# Patient Record
Sex: Female | Born: 2000
Health system: Southern US, Community
[De-identification: ages and names within clinical notes are randomized; demographics above are authoritative.]

## PROBLEM LIST (undated history)

## (undated) DIAGNOSIS — E063 Autoimmune thyroiditis: Secondary | ICD-10-CM

## (undated) DIAGNOSIS — E039 Hypothyroidism, unspecified: Secondary | ICD-10-CM

## (undated) DIAGNOSIS — L0591 Pilonidal cyst without abscess: Secondary | ICD-10-CM

## (undated) DIAGNOSIS — E282 Polycystic ovarian syndrome: Secondary | ICD-10-CM

## (undated) HISTORY — DX: Polycystic ovarian syndrome: E28.2

## (undated) HISTORY — DX: Autoimmune thyroiditis: E06.3

## (undated) HISTORY — DX: Hypothyroidism, unspecified: E03.9

---
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
## 2019-01-07 MED FILL — LEVOTHYROXINE 75 MCG TABLET: 75 | 90 days supply | Qty: 90 | Fill #1

## 2019-01-08 ENCOUNTER — Other Ambulatory Visit: Payer: Self-pay

## 2019-01-08 ENCOUNTER — Ambulatory Visit (INDEPENDENT_AMBULATORY_CARE_PROVIDER_SITE_OTHER): Payer: 59

## 2019-01-08 DIAGNOSIS — Z23 Encounter for immunization: Secondary | ICD-10-CM | POA: Diagnosis not present

## 2019-02-21 MED FILL — JULEBER 0.15-30 MG-MCG TABS: 0.15-30 | 84 days supply | Qty: 84 | Fill #1

## 2019-04-27 NOTE — Progress Notes (Signed)
This visit occurred during the SARS-CoV-2 public health emergency.  Safety protocols were in place, including screening questions prior to the visit, additional usage of staff PPE, and extensive cleaning of exam room while observing appropriate contact time as indicated for disinfecting solutions.    Chief Complaint  Patient presents with  . Back Pain    upper back pain pt states has been going on for a week and hurts when she swallows food. Pt went to ED last night due to it hurting so bad and chest pain 2 days ago     HPI: Rachel Skinner 19 y.o. come in for  Fu  Ed for about 10 days of  Right periscapular back pain  intermittent flare worse with swallowing but no gerd or dysphagia . No cough sob injury  Became worse and right chest pain   At night and seen in ED   Had neg cv  eval c xray and neg d dimer   Felt to be  MS poss  muscle relaxant  May be helping  .  No cough fever gi changes   doesn't remember pill choking and no foot getting stuck     Asks about getting updated thyroid labs ROS: See pertinent positives and negatives per HPI.  Past Medical History:  Diagnosis Date  . Hashimoto's disease   . Pilonidal cyst 01/2013   is open and draining    Family History  Problem Relation Age of Onset  . Heart disease Maternal Grandfather        possible MI  . Thyroid disease Maternal Grandmother     Social History   Socioeconomic History  . Marital status: Single    Spouse name: Not on file  . Number of children: Not on file  . Years of education: Not on file  . Highest education level: Not on file  Occupational History  . Not on file  Tobacco Use  . Smoking status: Never Smoker  . Smokeless tobacco: Never Used  Substance and Sexual Activity  . Alcohol use: No  . Drug use: No  . Sexual activity: Never  Other Topics Concern  . Not on file  Social History Narrative                  Social Determinants of Health   Financial Resource Strain:   . Difficulty of  Paying Living Expenses: Not on file  Food Insecurity:   . Worried About Charity fundraiser in the Last Year: Not on file  . Ran Out of Food in the Last Year: Not on file  Transportation Needs:   . Lack of Transportation (Medical): Not on file  . Lack of Transportation (Non-Medical): Not on file  Physical Activity:   . Days of Exercise per Week: Not on file  . Minutes of Exercise per Session: Not on file  Stress:   . Feeling of Stress : Not on file  Social Connections:   . Frequency of Communication with Friends and Family: Not on file  . Frequency of Social Gatherings with Friends and Family: Not on file  . Attends Religious Services: Not on file  . Active Member of Clubs or Organizations: Not on file  . Attends Archivist Meetings: Not on file  . Marital Status: Not on file    Outpatient Medications Prior to Visit  Medication Sig Dispense Refill  . acetaminophen (TYLENOL) 500 MG tablet Take 1,000 mg by mouth every 6 (six) hours as needed for  mild pain.    . cyclobenzaprine (FLEXERIL) 10 MG tablet Take 1 tablet (10 mg total) by mouth 2 (two) times daily as needed for muscle spasms. 20 tablet 0  . desogestrel-ethinyl estradiol (ENSKYCE) 0.15-30 MG-MCG tablet Take 1 tablet by mouth daily. 84 tablet 3  . ibuprofen (ADVIL) 200 MG tablet Take 400 mg by mouth every 6 (six) hours as needed for moderate pain.    Marland Kitchen levothyroxine (SYNTHROID, LEVOTHROID) 75 MCG tablet Take 1 tablet (75 mcg total) by mouth daily. 90 tablet 3  . predniSONE (DELTASONE) 50 MG tablet Take 1 tablet (50 mg total) by mouth daily for 4 days. (Patient not taking: Reported on 04/29/2019) 4 tablet 0   No facility-administered medications prior to visit.     EXAM:  BP 118/62 (BP Location: Right Arm, Patient Position: Sitting, Cuff Size: Normal)   Pulse (!) 102   Temp 98.2 F (36.8 C) (Temporal)   Wt 170 lb 3.2 oz (77.2 kg)   SpO2 99%   BMI 31.13 kg/m   Body mass index is 31.13 kg/m.  GENERAL: vitals  reviewed and listed above, alert, oriented, appears well hydrated and in no acute distress HEENT: atraumatic, conjunctiva  clear, no obvious abnormalities on inspection of external nose and ears OP : masked   NECK: no obvious masses on inspection palpation  LUNGS: clear to auscultation bilaterally, no wheezes, rales or rhonchi, good air movement  Back no point tenderness CV: HRRR, no clubbing cyanosis or  peripheral edema nl cap refill  Abdomen:  Sof,t normal bowel sounds without hepatosplenomegaly, no guarding rebound or masses no CVA tenderness MS: moves all extremities without noticeable focal  abnormality PSYCH: pleasant and cooperative, no obvious depression or anxiety Lab Results  Component Value Date   WBC 6.5 04/28/2019   HGB 14.2 04/28/2019   HCT 45.7 04/28/2019   PLT 298 04/28/2019   GLUCOSE 94 04/28/2019   CHOL 189 11/27/2017   TRIG 139.0 11/27/2017   HDL 89.80 11/27/2017   LDLCALC 71 11/27/2017   ALT 24 04/28/2019   AST 25 04/28/2019   NA 139 04/28/2019   K 3.9 04/28/2019   CL 107 04/28/2019   CREATININE 0.74 04/28/2019   BUN 8 04/28/2019   CO2 18 (L) 04/28/2019   TSH 1.41 04/29/2019   BP Readings from Last 3 Encounters:  04/29/19 118/62  04/28/19 (!) 123/59  03/11/18 112/74   ED visit eval reviewed   ASSESSMENT AND PLAN:  Discussed the following assessment and plan:  Acute right-sided thoracic back pain  Atypical chest pain  Hashimoto's disease - Plan: TSH, T4, free Uncertain cause  Suspect  Ms cause but curious that  worse with swallowing without dysphagia   Follow   Time   Care with pill swallowing  If  Not a lot better in 2 weeks consider further eval  Such a esophagram  But no obstructive sx etc  .   -Patient advised to return or notify health care team  if  new concerns arise.  Patient Instructions  Take pills with plenty of water  To ensure passage through esophagus  Quickly.   Ok to take  Muscle relaxant  as needed   IF not getting better in 2  week reassess.   Skinner notify you  of labs when available.  Of thyroid .    Neta Mends. Liset Mcmonigle M.D.

## 2019-04-28 ENCOUNTER — Encounter (HOSPITAL_COMMUNITY): Payer: Self-pay | Admitting: Emergency Medicine

## 2019-04-28 ENCOUNTER — Emergency Department (HOSPITAL_COMMUNITY): Payer: 59

## 2019-04-28 ENCOUNTER — Emergency Department (HOSPITAL_COMMUNITY)
Admission: EM | Admit: 2019-04-28 | Discharge: 2019-04-28 | Disposition: A | Discharge status: Home / Self Care | Payer: 59 | Attending: Emergency Medicine | Admitting: Emergency Medicine

## 2019-04-28 ENCOUNTER — Other Ambulatory Visit: Payer: Self-pay

## 2019-04-28 DIAGNOSIS — Z79899 Other long term (current) drug therapy: Secondary | ICD-10-CM | POA: Insufficient documentation

## 2019-04-28 DIAGNOSIS — R0789 Other chest pain: Secondary | ICD-10-CM | POA: Diagnosis not present

## 2019-04-28 DIAGNOSIS — M549 Dorsalgia, unspecified: Secondary | ICD-10-CM | POA: Insufficient documentation

## 2019-04-28 DIAGNOSIS — T7840XA Allergy, unspecified, initial encounter: Secondary | ICD-10-CM

## 2019-04-28 DIAGNOSIS — R079 Chest pain, unspecified: Secondary | ICD-10-CM | POA: Diagnosis not present

## 2019-04-28 LAB — CBC WITH DIFFERENTIAL/PLATELET
Abs Immature Granulocytes: 0.01 10*3/uL (ref 0.00–0.07)
Basophils Absolute: 0 10*3/uL (ref 0.0–0.1)
Basophils Relative: 1 %
Eosinophils Absolute: 0 10*3/uL (ref 0.0–0.5)
Eosinophils Relative: 1 %
HCT: 45.7 % (ref 36.0–46.0)
Hemoglobin: 14.2 g/dL (ref 12.0–15.0)
Immature Granulocytes: 0 %
Lymphocytes Relative: 46 %
Lymphs Abs: 3 10*3/uL (ref 0.7–4.0)
MCH: 28.5 pg (ref 26.0–34.0)
MCHC: 31.1 g/dL (ref 30.0–36.0)
MCV: 91.6 fL (ref 80.0–100.0)
Monocytes Absolute: 0.4 10*3/uL (ref 0.1–1.0)
Monocytes Relative: 6 %
Neutro Abs: 3.1 10*3/uL (ref 1.7–7.7)
Neutrophils Relative %: 46 %
Platelets: 298 10*3/uL (ref 150–400)
RBC: 4.99 MIL/uL (ref 3.87–5.11)
RDW: 13.2 % (ref 11.5–15.5)
WBC: 6.5 10*3/uL (ref 4.0–10.5)
nRBC: 0 % (ref 0.0–0.2)

## 2019-04-28 LAB — COMPREHENSIVE METABOLIC PANEL
ALT: 24 U/L (ref 0–44)
AST: 25 U/L (ref 15–41)
Albumin: 3.8 g/dL (ref 3.5–5.0)
Alkaline Phosphatase: 40 U/L (ref 38–126)
Anion gap: 14 (ref 5–15)
BUN: 8 mg/dL (ref 6–20)
CO2: 18 mmol/L — ABNORMAL LOW (ref 22–32)
Calcium: 9.7 mg/dL (ref 8.9–10.3)
Chloride: 107 mmol/L (ref 98–111)
Creatinine, Ser: 0.74 mg/dL (ref 0.44–1.00)
GFR calc Af Amer: >60 mL/min (ref >60)
GFR calc non Af Amer: >60 mL/min (ref >60)
Glucose, Bld: 94 mg/dL (ref 70–99)
Potassium: 3.9 mmol/L (ref 3.5–5.1)
Sodium: 139 mmol/L (ref 135–145)
Total Bilirubin: 0.6 mg/dL (ref 0.3–1.2)
Total Protein: 7.3 g/dL (ref 6.5–8.1)

## 2019-04-28 LAB — LIPASE, BLOOD: Lipase: 28 U/L (ref 11–51)

## 2019-04-28 LAB — TROPONIN I (HIGH SENSITIVITY)
Troponin I (High Sensitivity): <2 ng/L (ref <18)
Troponin I (High Sensitivity): <2 ng/L (ref <18)

## 2019-04-28 LAB — URINALYSIS, ROUTINE W REFLEX MICROSCOPIC
Bilirubin Urine: NEGATIVE
Glucose, UA: NEGATIVE mg/dL
Hgb urine dipstick: NEGATIVE
Ketones, ur: NEGATIVE mg/dL
Leukocytes,Ua: NEGATIVE
Nitrite: NEGATIVE
Protein, ur: NEGATIVE mg/dL
Specific Gravity, Urine: 1.01 (ref 1.005–1.030)
pH: 6 (ref 5.0–8.0)

## 2019-04-28 LAB — LACTIC ACID, PLASMA
Lactic Acid, Venous: 1.8 mmol/L (ref 0.5–1.9)
Lactic Acid, Venous: 1.1 mmol/L (ref 0.5–1.9)

## 2019-04-28 LAB — D-DIMER, QUANTITATIVE: D-Dimer, Quant: <0.27 ug/mL-FEU (ref 0.00–0.50)

## 2019-04-28 LAB — PREGNANCY, URINE: Preg Test, Ur: NEGATIVE

## 2019-04-28 MED ORDER — FAMOTIDINE IN NACL 20-0.9 MG/50ML-% IV SOLN
20.0000 mg | Freq: Once | INTRAVENOUS | Status: AC
  Administered 2019-04-28: 20 mg via INTRAVENOUS
  Filled 2019-04-28: qty 50

## 2019-04-28 MED ORDER — PREDNISONE 50 MG PO TABS: 50.0000 mg | ORAL_TABLET | Freq: Every day | ORAL | 0 refills | Status: AC

## 2019-04-28 MED ORDER — SODIUM CHLORIDE 0.9 % IV BOLUS
500.0000 mL | Freq: Once | INTRAVENOUS | Status: AC
  Administered 2019-04-28: 500 mL via INTRAVENOUS

## 2019-04-28 MED ORDER — CYCLOBENZAPRINE HCL 10 MG PO TABS: 10.0000 mg | ORAL_TABLET | Freq: Two times a day (BID) | ORAL | 0 refills | Status: DC | PRN

## 2019-04-28 MED ORDER — DIPHENHYDRAMINE HCL 50 MG/ML IJ SOLN
25.0000 mg | Freq: Once | INTRAMUSCULAR | Status: AC
  Administered 2019-04-28: 25 mg via INTRAVENOUS
  Filled 2019-04-28: qty 1

## 2019-04-28 MED ORDER — METHYLPREDNISOLONE SODIUM SUCC 125 MG IJ SOLR
125.0000 mg | Freq: Once | INTRAMUSCULAR | Status: AC
  Administered 2019-04-28: 125 mg via INTRAVENOUS
  Filled 2019-04-28: qty 2

## 2019-04-28 MED ORDER — MORPHINE SULFATE (PF) 4 MG/ML IV SOLN
4.0000 mg | Freq: Once | INTRAVENOUS | Status: AC
  Administered 2019-04-28: 4 mg via INTRAVENOUS
  Filled 2019-04-28: qty 1

## 2019-04-28 MED FILL — predniSONE 50 MG TABS: 50 | 4 days supply | Qty: 4 | Fill #0

## 2019-04-28 MED FILL — CYCLOBENZAPRINE HCL 10 MG T: 10 | 10 days supply | Qty: 20 | Fill #0

## 2019-04-28 NOTE — ED Triage Notes (Signed)
Pt reports having back pain for past week. Has PCP appointment tomorrow but was woken up with intense CP at 2 AM that has not gone away. Denies h/a, n/v

## 2019-04-28 NOTE — Discharge Instructions (Signed)
Your work-up today was overall reassuring with no evidence of a cardiac or pulmonary cause of her symptoms.  The blood clot test and cardiac enzymes were negative.  Her chest x-ray was completely reassuring.  Your vital signs were also reassuring while you are being monitored.  Your EKG did not show evidence of heart injury or arrhythmias.  We suspected likely chest wall or musculoskeletal pain causing your discomfort as it was worsened with different positions and your other work-up is reassuring.  After he received the pain medication, you had an allergic reaction called anaphylaxis.  We treated it with the antihistamines and steroids which significant improved her symptoms.  After monitoring for several hours, we feel you are safe for discharge home.  We are giving a prescription for steroids for the next 4 days starting tomorrow as you received today's dose already.  Please also use the muscle relaxants to help with the musculoskeletal discomfort and possible muscle spasms in your back where I was examining you.  Please follow-up with your primary doctor and if any symptoms change or worsen, please return to the nearest emergency department.  Please rest and stay hydrated.

## 2019-04-28 NOTE — ED Provider Notes (Signed)
[facility] EMERGENCY DEPARTMENT Provider Note   CSN: [id number] Arrival date & time: 04/28/19  0109     History Chief Complaint  Patient presents with  . Chest Pain  . Back Pain    Rachel Skinner is a 19 y.o. female.  The history is provided by the patient and medical records. No language interpreter was used.  Chest Pain Pain location:  R chest and substernal area Pain quality: aching, pressure and sharp   Pain radiates to:  Upper back Pain severity:  Severe Onset quality:  Sudden Duration:  6 hours Timing:  Constant Progression:  Waxing and waning Chronicity:  New Relieved by:  Nothing Worsened by:  Certain positions Ineffective treatments:  None tried Associated symptoms: back pain   Associated symptoms: no abdominal pain, no claudication, no cough, no diaphoresis, no dysphagia, no fatigue, no fever, no headache, no lower extremity edema, no nausea, no palpitations, no shortness of breath, no vomiting and no weakness   Risk factors: birth control   Risk factors: no coronary artery disease, no diabetes mellitus, no hypertension, not female, not pregnant, no prior DVT/PE, no smoking and no surgery        Past Medical History:  Diagnosis Date  . Hashimoto's disease   . Pilonidal cyst 01/2013   is open and draining    Patient Active Problem List   Diagnosis Date Noted  . Hashimoto's disease 03/11/2018  . Left ear pain 03/30/2014  . Wears glasses 03/30/2014  . Infected pilonidal cyst 03/04/2013  . Pilonidal sinus with abscess 01/31/2013  . Hyperpigmentation of skin 09/23/2012    Past Surgical History:  Procedure Laterality Date  . PILONIDAL CYST EXCISION N/A 03/04/2013   Procedure: EXCISION PILONIDAL CYST PEDIATRIC;  Surgeon: Judie Petit. Leonia Corona, MD;  Location: [facility];  Service: Pediatrics;  Laterality: N/A;  pilonidal area     OB History   No obstetric history on file.     Family History  Problem Relation Age of  Onset  . Heart disease Maternal Grandfather        possible MI  . Thyroid disease Maternal Grandmother     Social History   Tobacco Use  . Smoking status: Never Smoker  . Smokeless tobacco: Never Used  Substance Use Topics  . Alcohol use: No  . Drug use: No    Home Medications Prior to Admission medications   Medication Sig Start Date End Date Taking? Authorizing Provider  desogestrel-ethinyl estradiol (ENSKYCE) 0.15-30 MG-MCG tablet Take 1 tablet by mouth daily. 11/26/18   Panosh, Neta Mends, MD  levothyroxine (SYNTHROID, LEVOTHROID) 75 MCG tablet Take 1 tablet (75 mcg total) by mouth daily. 03/11/18   Shamleffer, Konrad Dolores, MD  MULTIPLE VITAMIN PO Take by mouth.    [provider]    Allergies    Clindamycin/lincomycin  Review of Systems   Review of Systems  Constitutional: Negative for chills, diaphoresis, fatigue and fever.  HENT: Negative for congestion and trouble swallowing.   Respiratory: Negative for cough, chest tightness, shortness of breath, wheezing and stridor.   Cardiovascular: Positive for chest pain. Negative for palpitations, claudication and leg swelling.  Gastrointestinal: Negative for abdominal pain, constipation, diarrhea, nausea and vomiting.  Genitourinary: Negative for flank pain and frequency.  Musculoskeletal: Positive for back pain. Negative for neck pain and neck stiffness.  Skin: Negative for rash and wound.  Neurological: Negative for weakness, light-headedness and headaches.  Psychiatric/Behavioral: Negative for agitation.  All other systems reviewed and are  negative.   Physical Exam Updated Vital Signs BP (!) 147/91   Pulse 88   Temp 98.7 F (37.1 C) (Oral)   Resp 20   Ht 5\' 2"  (1.575 m)   Wt 77.1 kg   SpO2 100%   BMI 31.09 kg/m   Physical Exam Vitals and nursing note reviewed.  Constitutional:      General: She is not in acute distress.    Appearance: Normal appearance. She is well-developed. She is not  ill-appearing, toxic-appearing or diaphoretic.  HENT:     Head: Normocephalic and atraumatic.     Nose: No congestion.  Eyes:     Conjunctiva/sclera: Conjunctivae normal.  Cardiovascular:     Rate and Rhythm: Regular rhythm. Tachycardia present.     Heart sounds: No murmur.  Pulmonary:     Effort: Pulmonary effort is normal. No respiratory distress.     Breath sounds: Normal breath sounds. No decreased breath sounds, wheezing, rhonchi or rales.  Chest:     Chest wall: No tenderness.  Abdominal:     General: Abdomen is flat.     Palpations: Abdomen is soft.     Tenderness: There is no abdominal tenderness. There is right CVA tenderness. There is no left CVA tenderness, guarding or rebound.  Musculoskeletal:        General: Tenderness present.     Cervical back: Neck supple. Tenderness present.       Back:     Right lower leg: No tenderness. No edema.     Left lower leg: No tenderness. No edema.  Skin:    General: Skin is warm and dry.     Capillary Refill: Capillary refill takes less than 2 seconds.     Findings: No erythema.  Neurological:     General: No focal deficit present.     Mental Status: She is alert.  Psychiatric:        Mood and Affect: Mood normal.     ED Results / Procedures / Treatments   Labs (all labs ordered are listed, but only abnormal results are displayed) Labs Reviewed  COMPREHENSIVE METABOLIC PANEL - Abnormal; Notable for the following components:      Result Value   CO2 18 (*)    All other components within normal limits  URINALYSIS, ROUTINE W REFLEX MICROSCOPIC - Abnormal; Notable for the following components:   Color, Urine STRAW (*)    All other components within normal limits  URINE CULTURE  CBC WITH DIFFERENTIAL/PLATELET  LIPASE, BLOOD  D-DIMER, QUANTITATIVE (NOT AT [facility])  LACTIC ACID, PLASMA  LACTIC ACID, PLASMA  PREGNANCY, URINE  TROPONIN I (HIGH SENSITIVITY)  TROPONIN I (HIGH SENSITIVITY)    EKG EKG  Interpretation  Date/Time:  Thursday April 28 2019 08:26:44 EST Ventricular Rate:  86 PR Interval:    QRS Duration: 99 QT Interval:  343 QTC Calculation: 411 R Axis:   64 Text Interpretation: Sinus rhythm No prior ECG for comparison. No STEMI Confirmed by [birth year] (Theda Belfast) on 04/28/2019 8:28:24 AM Also confirmed by 04/30/2019 (Theda Belfast), editor [id number] [phone])  on 04/28/2019 9:24:51 AM   Radiology DG Chest 2 View  Result Date: 04/28/2019 CLINICAL DATA:  Right-sided upper back pain and chest pain. EXAM: CHEST - 2 VIEW COMPARISON:  None. FINDINGS: The heart size and mediastinal contours are within normal limits. Both lungs are clear. The visualized skeletal structures are unremarkable. IMPRESSION: Normal exam. Electronically Signed   By: 04/30/2019 M.D.   On: 04/28/2019 08:47  Procedures Procedures (including critical care time)  Medications Ordered in ED Medications  morphine 4 MG/ML injection 4 mg (4 mg Intravenous Given 04/28/19 1058)  sodium chloride 0.9 % bolus 500 mL (0 mLs Intravenous Stopped 04/28/19 1256)  diphenhydrAMINE (BENADRYL) injection 25 mg (25 mg Intravenous Given 04/28/19 1116)  methylPREDNISolone sodium succinate (SOLU-MEDROL) 125 mg/2 mL injection 125 mg (125 mg Intravenous Given 04/28/19 1116)  famotidine (PEPCID) IVPB 20 mg premix (0 mg Intravenous Stopped 04/28/19 1148)    ED Course  I have reviewed the triage vital signs and the nursing notes.  Pertinent labs & imaging results that were available during my care of the patient were reviewed by me and considered in my medical decision making (see chart for details).    MDM Rules/Calculators/A&P                      Rachel Skinner is a 19 y.o. female with a past medical history significant for Hashimoto's disease currently on Synthroid who presents with 1 week of right sided back pain followed by several hours of severe chest pain.  Patient reports that for the last week she has had  right-sided back pain that has been constant but waxing and waning in severity.  She reports that she scheduled an appointment with her PCP for tomorrow morning but this morning woke up at 2 AM with severe chest pain.  She reports it is initially on the right side of her chest but is now across her chest.  She reports it is a tightness and pressure and stabbing at times.  She reports that she does not think it is pleuritic or exertional but it does seem to change at times with positioning.  She denies any history of DVT or PE but she is on an estrogen-based birth control.  She also reports that her mother recently got diagnosed with a blood clot several weeks ago.  She reports no fevers or chills and denies any coughing whatsoever.  She denies significant shortness of breath.  She denies any trauma.  She denies any rashes over the area.  She denies any history of back injury or heavy lifting.  She denies any history of kidney stones including no recent hematuria dysuria or urinary symptoms.  She denies any emesis, constipation, or diarrhea.  She simply has the severe chest pain that is a 9 out of 10 in severity and severe back pain.  She does not smoke.  On exam, chest is nontender.  Right lower back is slightly tender to palpation somewhat near the CVA area.  Lungs are clear.  No murmur.  Patient is good pulses in all extremities.  Legs are nontender nonedematous.  Abdomen is nontender.  No CVA tenderness.  EKG shows no STEMI.  Patient's heart rate was tachycardic during my initial evaluation.  Given the patient's tachycardia during my exam, estrogen use, family history of thromboembolic disease, and the patient's severe back and chest discomfort, will get a D-dimer to help rule out PE as the cause.  Will get other labs and chest x-ray as well.  Lower suspicion for kidney stone as etiology of discomfort but we will check a urinalysis.  She did not want pain medicine during initial work-up.  Anticipate  reassessment after work-up.  11:12 AM The patient's work-up returned overall reassuring.  Troponin negative x2.  D-dimer is undetectable.  Urinalysis and other labs reassuring.  Now we suspect is more likely musculoskeletal pain.  Due to the  continued discomfort, we agreed to give the patient a dose of IV pain medicine to try and help her symptoms before we felt she would be safe for going home.  After receiving the morphine, patient broke out with an anaphylactic reaction with urticaria diffusely, erythema, and jitteriness.  She did not have throat swelling or wheezing, nausea, or vomiting at this time.  Patient was given IV medications including steroids and antihistamines to help with her reaction and be monitored.  On reassessment, patient's reaction was drastically improving.  Rash on the chest and torso has nearly resolved.  Itching is better.  Patient is feeling better.  Will allow her to rest and monitor for recurrent reaction however anticipate she will be stable for discharge home with likely musculoskeletal chest pain to follow-up with PCP and be with family.  I spoke on the patient's phone via FaceTime with the patient's father who is a physician at this facility and we discussed the work-up and management.  He agrees with discharge after she is feeling better.  2:33 PM Patient's allergic reaction has completely resolved after steroids and antihistamines.  She is feeling much better.  Her chest pain is also resolved after the pain medicine.  Suspect muscular discomfort.  Had a shared decision made conversation with patient and we agreed that she was safe for discharge home.  She did want to transiently different than just the anti-inflammatory medications like Motrin Tylenol that she is already attempted to take and would be interested in a muscle relaxant.  We will give her prescription for Flexeril as well as steroids for the allergic reaction.  She is encouraged to take Benadryl if  needed for itching or rash recurrence.  Patient will follow up with her PCP and understands return cautions.  Patient discharged in good condition with resolution of symptoms.  Final Clinical Impression(s) / ED Diagnoses Final diagnoses:  Atypical chest pain  Upper back pain  Allergic reaction to drug, initial encounter    Rx / DC Orders ED Discharge Orders         Ordered    predniSONE (DELTASONE) 50 MG tablet  Daily     04/28/19 1436    cyclobenzaprine (FLEXERIL) 10 MG tablet  2 times daily PRN     04/28/19 1436         Clinical Impression: 1. Atypical chest pain   2. Upper back pain   3. Allergic reaction to drug, initial encounter     Disposition: Discharge  Condition: Good  I have discussed the results, Dx and Tx plan with the pt(& family if present). He/she/they expressed understanding and agree(s) with the plan. Discharge instructions discussed at great length. Strict return precautions discussed and pt &/or family have verbalized understanding of the instructions. No further questions at time of discharge.    New Prescriptions   CYCLOBENZAPRINE (FLEXERIL) 10 MG TABLET    Take 1 tablet (10 mg total) by mouth 2 (two) times daily as needed for muscle spasms.   PREDNISONE (DELTASONE) 50 MG TABLET    Take 1 tablet (50 mg total) by mouth daily for 4 days.    Follow Up: Madelin Headings, MD [address] [place] [address] [phone]     [facility] EMERGENCY DEPARTMENT [address] [id number] mc [place] [address] [phone]       Robbi Scurlock, Canary Brim, MD 04/28/19 [phone]

## 2019-04-28 NOTE — ED Notes (Signed)
Patient transported to XR. 

## 2019-04-29 ENCOUNTER — Encounter: Payer: Self-pay | Admitting: Internal Medicine

## 2019-04-29 ENCOUNTER — Ambulatory Visit (INDEPENDENT_AMBULATORY_CARE_PROVIDER_SITE_OTHER): Payer: 59 | Admitting: Internal Medicine

## 2019-04-29 VITALS — BP 118/62 | HR 102 | Temp 98.2°F | Wt 170.2 lb

## 2019-04-29 DIAGNOSIS — R0789 Other chest pain: Secondary | ICD-10-CM | POA: Diagnosis not present

## 2019-04-29 DIAGNOSIS — E063 Autoimmune thyroiditis: Secondary | ICD-10-CM

## 2019-04-29 DIAGNOSIS — M546 Pain in thoracic spine: Secondary | ICD-10-CM | POA: Diagnosis not present

## 2019-04-29 LAB — TSH: TSH: 1.41 u[IU]/mL (ref 0.40–5.00)

## 2019-04-29 LAB — URINE CULTURE

## 2019-04-29 LAB — T4, FREE: Free T4: 0.88 ng/dL (ref 0.60–1.60)

## 2019-04-29 NOTE — Patient Instructions (Signed)
Take pills with plenty of water  To ensure passage through esophagus  Quickly.   Ok to take  Muscle relaxant  as needed   IF not getting better in 2 week reassess.   Will notify you  of labs when available.  Of thyroid .

## 2019-05-13 MED FILL — JULEBER 0.15-30 MG-MCG TABS: 0.15-30 | 84 days supply | Qty: 84 | Fill #2

## 2019-05-17 ENCOUNTER — Encounter: Payer: 59 | Admitting: Internal Medicine

## 2019-06-06 ENCOUNTER — Other Ambulatory Visit: Payer: Self-pay | Admitting: Internal Medicine

## 2019-06-07 ENCOUNTER — Other Ambulatory Visit: Payer: Self-pay | Admitting: Internal Medicine

## 2019-06-07 NOTE — Telephone Encounter (Signed)
I looks like this medication was filled for a year last year from Dr. Lonzo Cloud at [facility], 03/11/2018,  # 90 with 3 refills.  I refused yesterday and it is coming back again. Are you filling this now or do you know if Dr. Lonzo Cloud is still seeing patient?

## 2019-06-07 NOTE — Telephone Encounter (Signed)
[facility] was last  prescriber  dr Lonzo Cloud should prescribe   Who saw her last in June 2020   However if she wants [place] to prescribe  And follow we can fill this  For 6 mos

## 2019-06-08 NOTE — Telephone Encounter (Signed)
Called patient and left a detailed voice message to see if she is still seeing Dr. Lonzo Cloud or if something has changed as to why we keep getting this request.

## 2019-06-10 ENCOUNTER — Other Ambulatory Visit: Payer: Self-pay | Admitting: Internal Medicine

## 2019-06-13 NOTE — Telephone Encounter (Signed)
Called and left a detailed voice message to ask patient if she is still seeing Dr. Lonzo Cloud for this prescription or if she is wanting Dr. Fabian Sharp to prescribe.

## 2019-06-16 ENCOUNTER — Other Ambulatory Visit: Payer: Self-pay

## 2019-06-16 ENCOUNTER — Encounter: Payer: Self-pay | Admitting: Internal Medicine

## 2019-06-16 NOTE — Progress Notes (Signed)
This visit occurred during the SARS-CoV-2 public health emergency.  Safety protocols were in place, including screening questions prior to the visit, additional usage of staff PPE, and extensive cleaning of exam room while observing appropriate contact time as indicated for disinfecting solutions.    Chief Complaint  Patient presents with  . Annual Exam    Doing okay    HPI: Patient  Rachel Skinner  19 y.o. comes in today for [place] visit  Thyroid   Asks [place] to take over the medication taking daily has had ts done in Feb and in range  Needs reill OCP  Still acceptable  1 p no sti sx  See last screen   Health Maintenance  Topic Date Due  . INFLUENZA VACCINE  10/02/2019  . TETANUS/TDAP  04/24/2021  . HIV Screening  Completed   Health Maintenance Review LIFESTYLE:  Exercise:    Gym   4 x per week.  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     ROS:  GEN/ HEENT: No fever, significant weight changes sweats headaches vision problems hearing changes, CV/ PULM; No chest pain shortness of breath cough, syncope,edema  change in exercise tolerance. GI /GU: No adominal pain, vomiting, change in bowel habits. No blood in the stool. No significant GU symptoms. SKIN/HEME: ,no acute skin rashes suspicious lesions or bleeding. No lymphadenopathy, nodules, masses.  NEURO/ PSYCH:  No neurologic signs such as weakness numbness. No depression anxiety. IMM/ Allergy: No unusual infections.  Allergy .   REST of 12 system review negative except as per HPI   Past Medical History:  Diagnosis Date  . Hashimoto's disease   . Infected pilonidal cyst 03/04/2013  . Pilonidal cyst 01/2013   is open and draining  . Pilonidal sinus with abscess 01/31/2013   draining   no mass  effect  empriric antibiotic cause of sx persistent and tender  fu in 2-3 weeks       Past Surgical History:  Procedure Laterality Date  . PILONIDAL CYST EXCISION N/A 03/04/2013   Procedure: EXCISION PILONIDAL CYST PEDIATRIC;  Surgeon: Jerilynn Mages. Gerald Stabs, MD;  Location: [facility];  Service: Pediatrics;  Laterality: N/A;  pilonidal area    Family History  Problem Relation Age of Onset  . Heart disease Maternal Grandfather        possible MI  . Thyroid disease Maternal Grandmother     Social History   Socioeconomic History  . Marital status: Single    Spouse name: Not on file  . Number of children: Not on file  . Years of education: Not on file  . Highest education level: Not on file  Occupational History  . Not on file  Tobacco Use  . Smoking status: Never Smoker  . Smokeless tobacco: Never Used  Substance and Sexual Activity  . Alcohol use: No  . Drug use: No  . Sexual activity: Never  Other Topics Concern  . Not on file  Social History Narrative                  Social Determinants of Health   Financial Resource Strain:   . Difficulty of Paying Living Expenses:   Food Insecurity:   . Worried About  Running Out of Food in the Last Year:   . [place] in the Last Year:   Transportation Needs:   . Lack of Transportation (Medical):   Marland Kitchen Lack of Transportation (Non-Medical):   Physical Activity:   . Days of Exercise per Week:   . Minutes of Exercise per Session:   Stress:   . Feeling of Stress :   Social Connections:   . Frequency of Communication with Friends and Family:   . Frequency of Social Gatherings with Friends and Family:   . Attends Religious Services:   . Active Member of Clubs or Organizations:   . Attends Archivist Meetings:   Marland Kitchen Marital Status:     Outpatient Medications Prior to Visit  Medication Sig Dispense Refill  . acetaminophen (TYLENOL) 500 MG tablet Take 1,000 mg by mouth every 6 (six) hours as needed for  mild pain.    Marland Kitchen desogestrel-ethinyl estradiol (ENSKYCE) 0.15-30 MG-MCG tablet Take 1 tablet by mouth daily. 84 tablet 3  . ibuprofen (ADVIL) 200 MG tablet Take 400 mg by mouth every 6 (six) hours as needed for moderate pain.    Marland Kitchen levothyroxine (SYNTHROID, LEVOTHROID) 75 MCG tablet Take 1 tablet (75 mcg total) by mouth daily. 90 tablet 3  . cyclobenzaprine (FLEXERIL) 10 MG tablet Take 1 tablet (10 mg total) by mouth 2 (two) times daily as needed for muscle spasms. (Patient not taking: Reported on 06/17/2019) 20 tablet 0   No facility-administered medications prior to visit.     EXAM:  BP 122/78   Pulse 82   Temp 98 F (36.7 C) (Temporal)   Ht 5' 2.25" (1.581 m)   Wt 164 lb 6.4 oz (74.6 kg)   SpO2 98%   BMI 29.83 kg/m   Body mass index is 29.83 kg/m. Wt Readings from Last 3 Encounters:  06/17/19 164 lb 6.4 oz (74.6 kg) (89 %, Z= 1.24)*  04/29/19 170 lb 3.2 oz (77.2 kg) (92 %, Z= 1.39)*  04/28/19 170 lb (77.1 kg) (92 %, Z= 1.38)*   * Growth percentiles are based on CDC (Girls, 2-20 Years) data.    Physical Exam: Vital signs reviewed EPP:IRJJ is a well-developed well-nourished alert cooperative    who appearsr stated age in no acute distress.  HEENT: normocephalic atraumatic , Eyes: PERRL EOM's full, conjunctiva clear,., Ears: no deformity EAC's soft wax  clear TMs with normal landmarks.partly viewed  Mouth: clear OP, masked NECK: supple without masses, thyromegaly or bruits. CHEST/PULM:  Clear to auscultation and percussion breath sounds equal no wheeze , rales or rhonchi. No chest wall deformities or tenderness. Breast: normal by inspection . No dimpling, discharge, masses, tenderness or discharge . CV: PMI is nondisplaced, S1 S2 no gallops, murmurs, rubs. Peripheral pulses are full without delay.No JVD .  ABDOMEN: Bowel sounds normal nontender  No guard or rebound, no hepato splenomegal no CVA tenderness.  No hernia. Extremtities:  No clubbing cyanosis or edema, no acute joint  swelling or redness no focal atrophy NEURO:  Oriented x3, cranial nerves 3-12 appear to be intact, no obvious focal weakness,gait within normal limits no abnormal reflexes or asymmetrical SKIN: No acute rashes normal turgor, color, no bruising or petechiae. PSYCH: Oriented, good eye contact, no obvious depression anxiety, cognition and judgment appear normal. LN: no cervical axillary inguinal adenopathy  Lab Results  Component Value Date   WBC 6.5 04/28/2019   HGB 14.2 04/28/2019   HCT 45.7 04/28/2019   PLT 298 04/28/2019  GLUCOSE 94 04/28/2019   CHOL 189 11/27/2017   TRIG 139.0 11/27/2017   HDL 89.80 11/27/2017   LDLCALC 71 11/27/2017   ALT 24 04/28/2019   AST 25 04/28/2019   NA 139 04/28/2019   K 3.9 04/28/2019   CL 107 04/28/2019   CREATININE 0.74 04/28/2019   BUN 8 04/28/2019   CO2 18 (L) 04/28/2019   TSH 1.41 04/29/2019    BP Readings from Last 3 Encounters:  06/17/19 122/78  04/29/19 118/62  04/28/19 (!) 123/59    Lab plan  reviewed with patient   ASSESSMENT AND PLAN:  Discussed the following assessment and plan:    ICD-10-CM   1. Visit for preventive health examination  Z00.00 Lipid panel  2. Medication management  Z79.899 Lipid panel  3. Oral contraceptive use  Z30.41   4. Hashimoto's disease  E06.3 Lipid panel  5. Screening, lipid  Z13.220 Lipid panel   fasting today   attend to BMI lsi continue  Refill meds as indecated for a  Year .  Or as needed   Return in about 1 year (around 06/16/2020) for preventive /cpx and medications.  Patient Care Team: Imri Lor, Standley Brooking, MD as PCP - General (Internal Medicine) Patient Instructions  Continue attention to lifestyle intervention healthy eating and exercise .  Will refill  Take over thyroid medication  To be seen yearly cpx   Td booster due in 2023     Preventive Care 54-103 Years Old, Female Preventive care refers to lifestyle choices and visits with your health care provider that can promote health and  wellness. At this stage in your life, you may start seeing a primary care physician instead of a pediatrician. Your health care is now your responsibility. Preventive care for young adults includes:  A yearly physical exam. This is also called an annual wellness visit.  Regular dental and eye exams.  Immunizations.  Screening for certain conditions.  Healthy lifestyle choices, such as diet and exercise. What can I expect for my preventive care visit? Physical exam Your health care provider may check:  Height and weight. These may be used to calculate body mass index (BMI), which is a measurement that tells if you are at a healthy weight.  Heart rate and blood pressure.  Body temperature. Counseling Your health care provider may ask you questions about:  Past medical problems and family medical history.  Alcohol, tobacco, and drug use.  Home and relationship well-being.  Access to firearms.  Emotional well-being.  Diet, exercise, and sleep habits.  Sexual activity and sexual health.  Method of birth control.  Menstrual cycle.  Pregnancy history. What immunizations do I need?  Influenza (flu) vaccine  This is recommended every year. Tetanus, diphtheria, and pertussis (Tdap) vaccine  You may need a Td booster every 10 years. Varicella (chickenpox) vaccine  You may need this vaccine if you have not already been vaccinated. Human papillomavirus (HPV) vaccine  If recommended by your health care provider, you may need three doses over 6 months. Measles, mumps, and rubella (MMR) vaccine  You may need at least one dose of MMR. You may also need a second dose. Meningococcal conjugate (MenACWY) vaccine  One dose is recommended if you are 34-73 years old and a Market researcher living in a residence hall, or if you have one of several medical conditions. You may also need additional booster doses. Pneumococcal conjugate (PCV13) vaccine  You may need this  if you have certain conditions and were  not previously vaccinated. Pneumococcal polysaccharide (PPSV23) vaccine  You may need one or two doses if you smoke cigarettes or if you have certain conditions. Hepatitis A vaccine  You may need this if you have certain conditions or if you travel or work in places where you may be exposed to hepatitis A. Hepatitis B vaccine  You may need this if you have certain conditions or if you travel or work in places where you may be exposed to hepatitis B. Haemophilus influenzae type b (Hib) vaccine  You may need this if you have certain risk factors. You may receive vaccines as individual doses or as more than one vaccine together in one shot (combination vaccines). Talk with your health care provider about the risks and benefits of combination vaccines. What tests do I need? Blood tests  Lipid and cholesterol levels. These may be checked every 5 years starting at age 34.  Hepatitis C test.  Hepatitis B test. Screening  Pelvic exam and Pap test. This may be done every 3 years starting at age 41.  Sexually transmitted disease (STD) testing, if you are at risk.  BRCA-related cancer screening. This may be done if you have a family history of breast, ovarian, tubal, or peritoneal cancers. Other tests  Tuberculosis skin test.  Vision and hearing tests.  Skin exam.  Breast exam. Follow these instructions at home: Eating and drinking   Eat a diet that includes fresh fruits and vegetables, whole grains, lean protein, and low-fat dairy products.  Drink enough fluid to keep your urine pale yellow.  Do not drink alcohol if: ? Your health care provider tells you not to drink. ? You are pregnant, may be pregnant, or are planning to become pregnant. ? You are under the legal drinking age. In the [place] the legal drinking age is 60.  If you drink alcohol: ? Limit how much you have to 0-1 drink a day. ? Be aware of how much alcohol is in your  drink. In the [place] one drink equals one 12 oz bottle of beer (355 mL), one 5 oz glass of wine (148 mL), or one 1 oz glass of hard liquor (44 mL). Lifestyle  Take daily care of your teeth and gums.  Stay active. Exercise at least 30 minutes 5 or more days of the week.  Do not use any products that contain nicotine or tobacco, such as cigarettes, e-cigarettes, and chewing tobacco. If you need help quitting, ask your health care provider.  Do not use drugs.  If you are sexually active, practice safe sex. Use a condom or other form of birth control (contraception) in order to prevent pregnancy and STIs (sexually transmitted infections). If you plan to become pregnant, see your health care provider for a pre-conception visit.  Find healthy ways to cope with stress, such as: ? Meditation, yoga, or listening to music. ? Journaling. ? Talking to a trusted person. ? Spending time with friends and family. Safety  Always wear your seat belt while driving or riding in a vehicle.  Do not drive if you have been drinking alcohol. Do not ride with someone who has been drinking.  Do not drive when you are tired or distracted. Do not text while driving.  Wear a helmet and other protective equipment during sports activities.  If you have firearms in your house, make sure you follow all gun safety procedures.  Seek help if you have been bullied, physically abused, or sexually abused.  Use the Internet  responsibly to avoid dangers such as online bullying and online sex predators. What's next?  Go to your health care provider once a year for a well check visit.  Ask your health care provider how often you should have your eyes and teeth checked.  Stay up to date on all vaccines. This information is not intended to replace advice given to you by your health care provider. Make sure you discuss any questions you have with your health care provider. Document Revised: 02/11/2018 Document Reviewed:  02/11/2018 Elsevier Patient Education  2020 [employer] Jazleen Robeck M.D.

## 2019-06-17 ENCOUNTER — Encounter: Payer: Self-pay | Admitting: Internal Medicine

## 2019-06-17 ENCOUNTER — Ambulatory Visit (INDEPENDENT_AMBULATORY_CARE_PROVIDER_SITE_OTHER): Payer: 59 | Admitting: Internal Medicine

## 2019-06-17 ENCOUNTER — Other Ambulatory Visit: Payer: Self-pay | Admitting: Internal Medicine

## 2019-06-17 ENCOUNTER — Other Ambulatory Visit: Payer: Self-pay

## 2019-06-17 VITALS — BP 122/78 | HR 82 | Temp 98.0°F | Ht 62.25 in | Wt 164.4 lb

## 2019-06-17 DIAGNOSIS — Z79899 Other long term (current) drug therapy: Secondary | ICD-10-CM | POA: Diagnosis not present

## 2019-06-17 DIAGNOSIS — E063 Autoimmune thyroiditis: Secondary | ICD-10-CM | POA: Diagnosis not present

## 2019-06-17 DIAGNOSIS — Z13220 Encounter for screening for lipoid disorders: Secondary | ICD-10-CM | POA: Diagnosis not present

## 2019-06-17 DIAGNOSIS — Z3041 Encounter for surveillance of contraceptive pills: Secondary | ICD-10-CM | POA: Diagnosis not present

## 2019-06-17 DIAGNOSIS — Z0000 Encounter for general adult medical examination without abnormal findings: Secondary | ICD-10-CM

## 2019-06-17 LAB — LIPID PANEL
Cholesterol: 201 mg/dL — ABNORMAL HIGH (ref 0–200)
HDL: 82.6 mg/dL (ref >39.00)
LDL Cholesterol: 99 mg/dL (ref 0–99)
NonHDL: 118.17
Total CHOL/HDL Ratio: 2
Triglycerides: 96 mg/dL (ref 0.0–149.0)
VLDL: 19.2 mg/dL (ref 0.0–40.0)

## 2019-06-17 MED ORDER — LEVOTHYROXINE SODIUM 75 MCG PO TABS: 75.0000 ug | ORAL_TABLET | Freq: Every day | ORAL | 3 refills | Status: DC

## 2019-06-17 MED FILL — LEVOTHYROXINE SODIUM 75 MCG: 75 | 90 days supply | Qty: 90 | Fill #0

## 2019-06-17 NOTE — Progress Notes (Signed)
Lipid panel still favorable

## 2019-06-17 NOTE — Patient Instructions (Addendum)
Continue attention to lifestyle intervention healthy eating and exercise .  Will refill  Take over thyroid medication  To be seen yearly cpx   Td booster due in 2023     Preventive Care 16-20 Years Old, Female Preventive care refers to lifestyle choices and visits with your health care provider that can promote health and wellness. At this stage in your life, you may start seeing a primary care physician instead of a pediatrician. Your health care is now your responsibility. Preventive care for young adults includes:  A yearly physical exam. This is also called an annual wellness visit.  Regular dental and eye exams.  Immunizations.  Screening for certain conditions.  Healthy lifestyle choices, such as diet and exercise. What can I expect for my preventive care visit? Physical exam Your health care provider may check:  Height and weight. These may be used to calculate body mass index (BMI), which is a measurement that tells if you are at a healthy weight.  Heart rate and blood pressure.  Body temperature. Counseling Your health care provider may ask you questions about:  Past medical problems and family medical history.  Alcohol, tobacco, and drug use.  Home and relationship well-being.  Access to firearms.  Emotional well-being.  Diet, exercise, and sleep habits.  Sexual activity and sexual health.  Method of birth control.  Menstrual cycle.  Pregnancy history. What immunizations do I need?  Influenza (flu) vaccine  This is recommended every year. Tetanus, diphtheria, and pertussis (Tdap) vaccine  You may need a Td booster every 10 years. Varicella (chickenpox) vaccine  You may need this vaccine if you have not already been vaccinated. Human papillomavirus (HPV) vaccine  If recommended by your health care provider, you may need three doses over 6 months. Measles, mumps, and rubella (MMR) vaccine  You may need at least one dose of MMR. You may also  need a second dose. Meningococcal conjugate (MenACWY) vaccine  One dose is recommended if you are 20-20 years old and a Market researcher living in a residence hall, or if you have one of several medical conditions. You may also need additional booster doses. Pneumococcal conjugate (PCV13) vaccine  You may need this if you have certain conditions and were not previously vaccinated. Pneumococcal polysaccharide (PPSV23) vaccine  You may need one or two doses if you smoke cigarettes or if you have certain conditions. Hepatitis A vaccine  You may need this if you have certain conditions or if you travel or work in places where you may be exposed to hepatitis A. Hepatitis B vaccine  You may need this if you have certain conditions or if you travel or work in places where you may be exposed to hepatitis B. Haemophilus influenzae type b (Hib) vaccine  You may need this if you have certain risk factors. You may receive vaccines as individual doses or as more than one vaccine together in one shot (combination vaccines). Talk with your health care provider about the risks and benefits of combination vaccines. What tests do I need? Blood tests  Lipid and cholesterol levels. These may be checked every 5 years starting at age 20.  Hepatitis C test.  Hepatitis B test. Screening  Pelvic exam and Pap test. This may be done every 3 years starting at age 20.  Sexually transmitted disease (STD) testing, if you are at risk.  BRCA-related cancer screening. This may be done if you have a family history of breast, ovarian, tubal, or peritoneal cancers.  Other tests  Tuberculosis skin test.  Vision and hearing tests.  Skin exam.  Breast exam. Follow these instructions at home: Eating and drinking   Eat a diet that includes fresh fruits and vegetables, whole grains, lean protein, and low-fat dairy products.  Drink enough fluid to keep your urine pale yellow.  Do not drink alcohol  if: ? Your health care provider tells you not to drink. ? You are pregnant, may be pregnant, or are planning to become pregnant. ? You are under the legal drinking age. In the [place] the legal drinking age is 45.  If you drink alcohol: ? Limit how much you have to 0-1 drink a day. ? Be aware of how much alcohol is in your drink. In the [place] one drink equals one 12 oz bottle of beer (355 mL), one 5 oz glass of wine (148 mL), or one 1 oz glass of hard liquor (44 mL). Lifestyle  Take daily care of your teeth and gums.  Stay active. Exercise at least 30 minutes 5 or more days of the week.  Do not use any products that contain nicotine or tobacco, such as cigarettes, e-cigarettes, and chewing tobacco. If you need help quitting, ask your health care provider.  Do not use drugs.  If you are sexually active, practice safe sex. Use a condom or other form of birth control (contraception) in order to prevent pregnancy and STIs (sexually transmitted infections). If you plan to become pregnant, see your health care provider for a pre-conception visit.  Find healthy ways to cope with stress, such as: ? Meditation, yoga, or listening to music. ? Journaling. ? Talking to a trusted person. ? Spending time with friends and family. Safety  Always wear your seat belt while driving or riding in a vehicle.  Do not drive if you have been drinking alcohol. Do not ride with someone who has been drinking.  Do not drive when you are tired or distracted. Do not text while driving.  Wear a helmet and other protective equipment during sports activities.  If you have firearms in your house, make sure you follow all gun safety procedures.  Seek help if you have been bullied, physically abused, or sexually abused.  Use the Internet responsibly to avoid dangers such as online bullying and online sex predators. What's next?  Go to your health care provider once a year for a well check visit.  Ask your  health care provider how often you should have your eyes and teeth checked.  Stay up to date on all vaccines. This information is not intended to replace advice given to you by your health care provider. Make sure you discuss any questions you have with your health care provider. Document Revised: 02/11/2018 Document Reviewed: 02/11/2018 Elsevier Patient Education  2020 [employer].

## 2019-08-05 MED FILL — JULEBER 0.15-30 MG-MCG TABS: 0.15-30 | 84 days supply | Qty: 84 | Fill #3

## 2019-10-11 MED FILL — LEVOTHYROXINE SODIUM 75 MCG: 75 | 90 days supply | Qty: 90 | Fill #1

## 2019-10-25 ENCOUNTER — Other Ambulatory Visit: Payer: Self-pay

## 2019-10-25 MED ORDER — DESOGESTREL-ETHINYL ESTRADIOL 0.15-30 MG-MCG PO TABS: 1.0000 | ORAL_TABLET | Freq: Every day | ORAL | 0 refills | Status: DC

## 2019-12-23 ENCOUNTER — Other Ambulatory Visit: Payer: Self-pay | Admitting: Internal Medicine

## 2020-01-03 DIAGNOSIS — Z20828 Contact with and (suspected) exposure to other viral communicable diseases: Secondary | ICD-10-CM | POA: Diagnosis not present

## 2020-01-18 MED ORDER — DESOGESTREL-ETHINYL ESTRADIOL 0.15-30 MG-MCG PO TABS: 1.0000 | ORAL_TABLET | Freq: Every day | ORAL | 1 refills | Status: DC

## 2020-02-27 MED FILL — LEVOTHYROXINE 75 MCG TABLET: 75 | 90 days supply | Qty: 90 | Fill #2

## 2020-03-12 ENCOUNTER — Other Ambulatory Visit: Payer: Self-pay | Admitting: Internal Medicine

## 2020-03-12 MED ORDER — DESOGESTREL-ETHINYL ESTRADIOL 0.15-30 MG-MCG PO TABS: 1.0000 | ORAL_TABLET | Freq: Every day | ORAL | 1 refills | Status: DC

## 2020-03-12 NOTE — Telephone Encounter (Signed)
Sent in electronically .  Arrange  Yearly visit  Before runs out of medication( last one was in April 2021)

## 2020-03-14 MED FILL — JULEBER 0.15-30 MG-MCG TABS: 0.15-30 | 84 days supply | Qty: 84 | Fill #0

## 2020-03-30 DIAGNOSIS — Z03818 Encounter for observation for suspected exposure to other biological agents ruled out: Secondary | ICD-10-CM | POA: Diagnosis not present

## 2020-06-04 ENCOUNTER — Other Ambulatory Visit (HOSPITAL_COMMUNITY): Payer: Self-pay

## 2020-06-04 ENCOUNTER — Other Ambulatory Visit: Payer: Self-pay

## 2020-06-04 MED ORDER — DESOGESTREL-ETHINYL ESTRADIOL 0.15-30 MG-MCG PO TABS
1.0000 | ORAL_TABLET | Freq: Every day | ORAL | 0 refills | Status: DC
  Filled 2020-06-04: qty 84, 84d supply, fill #0

## 2020-06-06 ENCOUNTER — Other Ambulatory Visit (HOSPITAL_COMMUNITY): Payer: Self-pay

## 2020-07-13 ENCOUNTER — Other Ambulatory Visit: Payer: Self-pay

## 2020-07-13 MED ORDER — LEVOTHYROXINE SODIUM 75 MCG PO TABS
ORAL_TABLET | Freq: Every day | ORAL | 0 refills | Status: DC
Start: 2020-07-13 — End: 2020-08-17

## 2020-07-13 NOTE — Telephone Encounter (Signed)
Pt is calling to check the status of the medication and would like to see if it can be called in today so that she can pick it up due to her going out of town today.  She is aware that the msg is on her providers desktop for her to answer.

## 2020-07-13 NOTE — Telephone Encounter (Signed)
Rx sent to pt [facility] pharmacy per Dr Clent Ridges

## 2020-07-13 NOTE — Telephone Encounter (Signed)
I see she is scheduled to see Dr. Fabian Sharp on June 13. Please call in a 30 day refill of this med to her [facility]

## 2020-08-12 NOTE — Progress Notes (Signed)
Chief Complaint  Patient presents with   Annual Exam     HPI: Patient  Rachel Skinner  20 y.o. comes in today for Preventive Health Care visit and medicine check. She is come down from [place] after graduating and to return this summer working at [employer].  Will be going into new apartment  No major health changes   right pain i Dull discomfort at times does not usually use Q-tips Concerned about weight gain ever since she has been on thyroid medicine is due for thyroid testing would like [place] to initiate a referral to Dr. Dr Sharl Ma endocrinology as possible  Has weight gain with help with nutrition dietitian dietician.   Sleep: 6- 8   hours Actiivity  not as much  .  No sig sweet beverages . Ocass etoh neg td .  Tries to eat  self. Not too unhealthy.  Feels from thyroid . Takes correctly .  1 partner for years no STI noted risk Periods fairly regular on OCPs no concerns Has not yet had Pap smear pelvic would like to delay today.   Health Maintenance  Topic Date Due   Pneumococcal Vaccine 56-71 Years old (1 - PCV) Never done   Hepatitis C Screening  Never done   Zoster Vaccines- Shingrix (1 of 2) Never done   COVID-19 Vaccine (4 - Booster for Moderna series) 05/15/2020   PAP-Cervical Cytology Screening  07/20/2020   PAP SMEAR-Modifier  07/20/2020   INFLUENZA VACCINE  10/01/2020   TETANUS/TDAP  04/24/2021   HPV VACCINES  Completed   HIV Screening  Completed   Health Maintenance Review LIFESTYLE:  Exercise:   Tobacco/ETS: Alcohol:  Sugar beverages: Sleep: Drug use: no HH of  Work: Periods  3 days    ROS:  GEN/ HEENT: No fever, significant weight changes sweats headaches vision problems hearing changes, CV/ PULM; No chest pain shortness of breath cough, syncope,edema  change in exercise tolerance. GI /GU: No adominal pain, vomiting, change in bowel habits. No blood in the stool. No significant GU symptoms. SKIN/HEME: ,no acute skin rashes  suspicious lesions or bleeding. No lymphadenopathy, nodules, masses.  NEURO/ PSYCH:  No neurologic signs such as weakness numbness. No depression anxiety. IMM/ Allergy: No unusual infections.  Allergy .   REST of 12 system review negative except as per HPI   Past Medical History:  Diagnosis Date   Hashimoto's disease    Infected pilonidal cyst 03/04/2013   Pilonidal cyst 01/2013   is open and draining   Pilonidal sinus with abscess 01/31/2013   draining   no mass effect  empriric antibiotic cause of sx persistent and tender  fu in 2-3 weeks       Past Surgical History:  Procedure Laterality Date   PILONIDAL CYST EXCISION N/A 03/04/2013   Procedure: EXCISION PILONIDAL CYST PEDIATRIC;  Surgeon: Judie Petit. Leonia Corona, MD;  Location: [facility];  Service: Pediatrics;  Laterality: N/A;  pilonidal area    Family History  Problem Relation Age of Onset   Heart disease Maternal Grandfather        possible MI   Thyroid disease Maternal Grandmother     Social History   Socioeconomic History   Marital status: Single    Spouse name: Not on file   Number of children: Not on file   Years of education: Not on file   Highest education level: Not on file  Occupational History   Not on file  Tobacco  Use   Smoking status: Never   Smokeless tobacco: Never  Vaping Use   Vaping Use: Never used  Substance and Sexual Activity   Alcohol use: No   Drug use: No   Sexual activity: Never  Other Topics Concern   Not on file  Social History Narrative                  Social Determinants of Health   Financial Resource Strain: Not on file  Food Insecurity: Not on file  Transportation Needs: Not on file  Physical Activity: Not on file  Stress: Not on file  Social Connections: Not on file    Outpatient Medications Prior to Visit  Medication Sig Dispense Refill   acetaminophen (TYLENOL) 500 MG tablet Take 1,000 mg by mouth every 6 (six) hours as needed for mild pain.      cyclobenzaprine (FLEXERIL) 10 MG tablet Take 1 tablet (10 mg total) by mouth 2 (two) times daily as needed for muscle spasms. 20 tablet 0   desogestrel-ethinyl estradiol (APRI) 0.15-30 MG-MCG tablet Take 1 tablet by mouth daily. 84 tablet 0   ibuprofen (ADVIL) 200 MG tablet Take 400 mg by mouth every 6 (six) hours as needed for moderate pain.     levothyroxine (SYNTHROID) 75 MCG tablet TAKE 1 TABLET (75 MCG TOTAL) BY MOUTH DAILY. 30 tablet 0   No facility-administered medications prior to visit.     EXAM:  BP 130/88 (BP Location: Left Arm, Patient Position: Sitting, Cuff Size: Normal)   Pulse 91   Temp 98.3 F (36.8 C) (Oral)   Ht  (1.575 m)   Wt 182 lb 3.2 oz (82.6 kg)   LMP 07/30/2020 (Approximate)   SpO2 95%   BMI 33.32 kg/m   Body mass index is 33.32 kg/m. Wt Readings from Last 3 Encounters:  08/13/20 182 lb 3.2 oz (82.6 kg)  06/17/19 164 lb 6.4 oz (74.6 kg) (89 %, Z= 1.24)*  04/29/19 170 lb 3.2 oz (77.2 kg) (92 %, Z= 1.39)*   * Growth percentiles are based on CDC (Girls, 2-20 Years) data.    Physical Exam: Vital signs reviewed [id number] is a well-developed well-nourished alert cooperative    who appearsr stated age in no acute distress.  HEENT: normocephalic atraumatic , Eyes: PERRL EOM's full, conjunctiva clear, Nares: paten,t no deformity discharge or tenderness., Ears: no deformity EAC's soft wax right more than left  brown  Mouth: masked  NECK: supple without masses, thyroid palpable no nodules felt  CHEST/PULM:  Clear to auscultation and percussion breath sounds equal no wheeze , rales or rhonchi. No chest wall deformities or tenderness. Breast: normal by inspection . No dimpling, discharge, masses, tenderness or discharge . CV: PMI is nondisplaced, S1 S2 no gallops, murmurs, rubs. Peripheral pulses are full without delay.No JVD .  ABDOMEN: Bowel sounds normal nontender  No guard or rebound, no hepato splenomegal no CVA tenderness.  Extremtities:  No clubbing  cyanosis or edema, no acute joint swelling or redness no focal atrophy NEURO:  Oriented x3, cranial nerves 3-12 appear to be intact, no obvious focal weakness,gait within normal limits no abnormal reflexes or asymmetrical SKIN: No acute rashes normal turgor, color, no bruising or petechiae. PSYCH: Oriented, good eye contact, no obvious depression anxiety, cognition and judgment appear normal. LN: no cervical axillary inguinal adenopathy  Lab Results  Component Value Date   WBC 6.5 04/28/2019   HGB 14.2 04/28/2019   HCT 45.7 04/28/2019   PLT 298 04/28/2019  GLUCOSE 94 04/28/2019   CHOL 201 (H) 06/17/2019   TRIG 96.0 06/17/2019   HDL 82.60 06/17/2019   LDLCALC 99 06/17/2019   ALT 24 04/28/2019   AST 25 04/28/2019   NA 139 04/28/2019   K 3.9 04/28/2019   CL 107 04/28/2019   CREATININE 0.74 04/28/2019   BUN 8 04/28/2019   CO2 18 (L) 04/28/2019   TSH 1.41 04/29/2019    BP Readings from Last 3 Encounters:  08/13/20 130/88  06/17/19 122/78  04/29/19 118/62    Lab plan reviewed with patient  fasting today   ASSESSMENT AND PLAN:  Discussed the following assessment and plan:    ICD-10-CM   1. Visit for preventive health examination  Z00.00 Basic metabolic panel    CBC with Differential/Platelet    Hepatic function panel    Lipid panel    TSH    T4, free    Hemoglobin A1c    Hemoglobin A1c    T4, free    TSH    Lipid panel    Hepatic function panel    CBC with Differential/Platelet    Basic metabolic panel    2. Oral contraceptive use  Z30.41     3. Hashimoto's disease  E06.3 Basic metabolic panel    CBC with Differential/Platelet    Hepatic function panel    Lipid panel    TSH    T4, free    Hemoglobin A1c    Amb ref to Medical Nutrition Therapy-MNT    Ambulatory referral to Endocrinology    Hemoglobin A1c    T4, free    TSH    Lipid panel    Hepatic function panel    CBC with Differential/Platelet    Basic metabolic panel    4. Medication management   Z79.899 Basic metabolic panel    CBC with Differential/Platelet    Hepatic function panel    Lipid panel    TSH    T4, free    Hemoglobin A1c    Ambulatory referral to Endocrinology    Hemoglobin A1c    T4, free    TSH    Lipid panel    Hepatic function panel    CBC with Differential/Platelet    Basic metabolic panel    5. Weight gain  R63.5 Basic metabolic panel    CBC with Differential/Platelet    Hepatic function panel    Lipid panel    TSH    T4, free    Hemoglobin A1c    Amb ref to Medical Nutrition Therapy-MNT    Ambulatory referral to Endocrinology    Hemoglobin A1c    T4, free    TSH    Lipid panel    Hepatic function panel    CBC with Differential/Platelet    Basic metabolic panel    6. Need for hepatitis C screening test  Z11.59 Hepatitis C antibody    Hepatitis C antibody    7. Wax in ear  H61.20     Is very adherent to her thyroid medication due for monitoring Weight gain has been a problem could be lifestyle plus minus thyroid. Will refer to nutrition dietitian as requested and reviewed some basics. Tracking weight watchres type  sleep etc. Will place referral to Dr. Sharl Ma as requested. She will be due for her first Pap smear smear this year as for delay can be done here or elsewhere or GYN during the coming years ,low risk. See instructions. Will send in 90  days refills as appropriate  depending on tsh  levels  Return for depending on results and as indicated  at least yearly. .  Patient Care Team: Chianna Spirito, Neta Mends, MD as PCP - General (Internal Medicine) Patient Instructions  Exam is fine but have wax in ear . Can use softening drops and then otc flushing  if not can come back for  removal.  Due for PAP and routine  GYNE exam this year . Can be done here or  elsewhere Will notify you  of labs when available.  Will do a nutritional referral . And to Dr. Sharl Ma as requested.   Avoid   processed carbohydrates and added sugars to foods.  Avoid portion  distortion. Get your sleep  Track intake and activity sleep level to help make changes.  Health Maintenance, Female Adopting a healthy lifestyle and getting preventive care are important in promoting health and wellness. Ask your health care provider about: The right schedule for you to have regular tests and exams. Things you can do on your own to prevent diseases and keep yourself healthy. What should I know about diet, weight, and exercise? Eat a healthy diet  Eat a diet that includes plenty of vegetables, fruits, low-fat dairy products, and lean protein. Do not eat a lot of foods that are high in solid fats, added sugars, or sodium.  Maintain a healthy weight Body mass index (BMI) is used to identify weight problems. It estimates body fat based on height and weight. Your health care provider can help determineyour BMI and help you achieve or maintain a healthy weight. Get regular exercise Get regular exercise. This is one of the most important things you can do for your health. Most adults should: Exercise for at least 150 minutes each week. The exercise should increase your heart rate and make you sweat (moderate-intensity exercise). Do strengthening exercises at least twice a week. This is in addition to the moderate-intensity exercise. Spend less time sitting. Even light physical activity can be beneficial. Watch cholesterol and blood lipids Have your blood tested for lipids and cholesterol at 21 years of age, then havethis test every 5 years. Have your cholesterol levels checked more often if: Your lipid or cholesterol levels are high. You are older than 21 years of age. You are at high risk for heart disease. What should I know about cancer screening? Depending on your health history and family history, you may need to have cancer screening at various ages. This may include screening for: Breast cancer. Cervical cancer. Colorectal cancer. Skin cancer. Lung cancer. What  should I know about heart disease, diabetes, and high blood pressure? Blood pressure and heart disease High blood pressure causes heart disease and increases the risk of stroke. This is more likely to develop in people who have high blood pressure readings, are of African descent, or are overweight. Have your blood pressure checked: Every 3-5 years if you are 23-108 years of age. Every year if you are 72 years old or older. Diabetes Have regular diabetes screenings. This checks your fasting blood sugar level. Have the screening done: Once every three years after age 22 if you are at a normal weight and have a low risk for diabetes. More often and at a younger age if you are overweight or have a high risk for diabetes. What should I know about preventing infection? Hepatitis B If you have a higher risk for hepatitis B, you should be screened for this virus. Talk with your  health care provider to find out if you are at risk forhepatitis B infection. Hepatitis C Testing is recommended for: Everyone born from [birth year] through 1965. Anyone with known risk factors for hepatitis C. Sexually transmitted infections (STIs) Get screened for STIs, including gonorrhea and chlamydia, if: You are sexually active and are younger than 21 years of age. You are older than 21 years of age and your health care provider tells you that you are at risk for this type of infection. Your sexual activity has changed since you were last screened, and you are at increased risk for chlamydia or gonorrhea. Ask your health care provider if you are at risk. Ask your health care provider about whether you are at high risk for HIV. Your health care provider may recommend a prescription medicine to help prevent HIV infection. If you choose to take medicine to prevent HIV, you should first get tested for HIV. You should then be tested every 3 months for as long as you are taking the medicine. Pregnancy If you are about to stop having  your period (premenopausal) and you may become pregnant, seek counseling before you get pregnant. Take 400 to 800 micrograms (mcg) of folic acid every day if you become pregnant. Ask for birth control (contraception) if you want to prevent pregnancy. Osteoporosis and menopause Osteoporosis is a disease in which the bones lose minerals and strength with aging. This can result in bone fractures. If you are 73103 years old or older, or if you are at risk for osteoporosis and fractures, ask your health care provider if you should: Be screened for bone loss. Take a calcium or vitamin D supplement to lower your risk of fractures. Be given hormone replacement therapy (HRT) to treat symptoms of menopause. Follow these instructions at home: Lifestyle Do not use any products that contain nicotine or tobacco, such as cigarettes, e-cigarettes, and chewing tobacco. If you need help quitting, ask your health care provider. Do not use street drugs. Do not share needles. Ask your health care provider for help if you need support or information about quitting drugs. Alcohol use Do not drink alcohol if: Your health care provider tells you not to drink. You are pregnant, may be pregnant, or are planning to become pregnant. If you drink alcohol: Limit how much you use to 0-1 drink a day. Limit intake if you are breastfeeding. Be aware of how much alcohol is in your drink. In the [place] one drink equals one 12 oz bottle of beer (355 mL), one 5 oz glass of wine (148 mL), or one 1 oz glass of hard liquor (44 mL). General instructions Schedule regular health, dental, and eye exams. Stay current with your vaccines. Tell your health care provider if: You often feel depressed. You have ever been abused or do not feel safe at home. Summary Adopting a healthy lifestyle and getting preventive care are important in promoting health and wellness. Follow your health care provider's instructions about healthy diet,  exercising, and getting tested or screened for diseases. Follow your health care provider's instructions on monitoring your cholesterol and blood pressure. This information is not intended to replace advice given to you by your health care provider. Make sure you discuss any questions you have with your healthcare provider. Document Revised: 02/10/2018 Document Reviewed: 02/10/2018 Elsevier Patient Education  2022 [employer]     TancredWanda K. Tarren Sabree M.D.

## 2020-08-13 ENCOUNTER — Other Ambulatory Visit: Payer: Self-pay

## 2020-08-13 ENCOUNTER — Encounter: Payer: Self-pay | Admitting: Internal Medicine

## 2020-08-13 ENCOUNTER — Ambulatory Visit (INDEPENDENT_AMBULATORY_CARE_PROVIDER_SITE_OTHER): Payer: 59 | Admitting: Internal Medicine

## 2020-08-13 VITALS — BP 130/88 | HR 91 | Temp 98.3°F | Ht 62.0 in | Wt 182.2 lb

## 2020-08-13 DIAGNOSIS — Z1159 Encounter for screening for other viral diseases: Secondary | ICD-10-CM | POA: Diagnosis not present

## 2020-08-13 DIAGNOSIS — Z0000 Encounter for general adult medical examination without abnormal findings: Secondary | ICD-10-CM | POA: Diagnosis not present

## 2020-08-13 DIAGNOSIS — E063 Autoimmune thyroiditis: Secondary | ICD-10-CM

## 2020-08-13 DIAGNOSIS — Z79899 Other long term (current) drug therapy: Secondary | ICD-10-CM | POA: Diagnosis not present

## 2020-08-13 DIAGNOSIS — H6120 Impacted cerumen, unspecified ear: Secondary | ICD-10-CM | POA: Diagnosis not present

## 2020-08-13 DIAGNOSIS — R635 Abnormal weight gain: Secondary | ICD-10-CM | POA: Diagnosis not present

## 2020-08-13 DIAGNOSIS — Z3041 Encounter for surveillance of contraceptive pills: Secondary | ICD-10-CM

## 2020-08-13 LAB — CBC WITH DIFFERENTIAL/PLATELET
Basophils Absolute: 0 10*3/uL (ref 0.0–0.1)
Basophils Relative: 0.4 % (ref 0.0–3.0)
Eosinophils Absolute: 0 10*3/uL (ref 0.0–0.7)
Eosinophils Relative: 0.7 % (ref 0.0–5.0)
HCT: 41.1 % (ref 36.0–46.0)
Hemoglobin: 13.7 g/dL (ref 12.0–15.0)
Lymphocytes Relative: 34.3 % (ref 12.0–46.0)
Lymphs Abs: 1.7 10*3/uL (ref 0.7–4.0)
MCHC: 33.3 g/dL (ref 30.0–36.0)
MCV: 89.6 fl (ref 78.0–100.0)
Monocytes Absolute: 0.3 10*3/uL (ref 0.1–1.0)
Monocytes Relative: 5.9 % (ref 3.0–12.0)
Neutro Abs: 2.8 10*3/uL (ref 1.4–7.7)
Neutrophils Relative %: 58.7 % (ref 43.0–77.0)
Platelets: 291 10*3/uL (ref 150.0–400.0)
RBC: 4.59 Mil/uL (ref 3.87–5.11)
RDW: 13.2 % (ref 11.5–15.5)
WBC: 4.8 10*3/uL (ref 4.0–10.5)

## 2020-08-13 LAB — BASIC METABOLIC PANEL
BUN: 10 mg/dL (ref 6–23)
CO2: 22 mEq/L (ref 19–32)
Calcium: 9.4 mg/dL (ref 8.4–10.5)
Chloride: 105 mEq/L (ref 96–112)
Creatinine, Ser: 0.69 mg/dL (ref 0.40–1.20)
GFR: 124.37 mL/min (ref >60.00)
Glucose, Bld: 80 mg/dL (ref 70–99)
Potassium: 4.4 mEq/L (ref 3.5–5.1)
Sodium: 137 mEq/L (ref 135–145)

## 2020-08-13 LAB — LIPID PANEL
Cholesterol: 215 mg/dL — ABNORMAL HIGH (ref 0–200)
HDL: 98.1 mg/dL (ref >39.00)
LDL Cholesterol: 95 mg/dL (ref 0–99)
NonHDL: 116.51
Total CHOL/HDL Ratio: 2
Triglycerides: 109 mg/dL (ref 0.0–149.0)
VLDL: 21.8 mg/dL (ref 0.0–40.0)

## 2020-08-13 LAB — HEPATIC FUNCTION PANEL
ALT: 14 U/L (ref 0–35)
AST: 16 U/L (ref 0–37)
Albumin: 4.2 g/dL (ref 3.5–5.2)
Alkaline Phosphatase: 36 U/L — ABNORMAL LOW (ref 39–117)
Bilirubin, Direct: 0.1 mg/dL (ref 0.0–0.3)
Total Bilirubin: 0.6 mg/dL (ref 0.2–1.2)
Total Protein: 7.3 g/dL (ref 6.0–8.3)

## 2020-08-13 LAB — T4, FREE: Free T4: 0.76 ng/dL (ref 0.60–1.60)

## 2020-08-13 LAB — TSH: TSH: 3.82 u[IU]/mL (ref 0.35–4.50)

## 2020-08-13 LAB — HEMOGLOBIN A1C: Hgb A1c MFr Bld: 5.1 % (ref 4.6–6.5)

## 2020-08-13 NOTE — Patient Instructions (Signed)
Exam is fine but have wax in ear . Can use softening drops and then otc flushing  if not can come back for  removal.  Due for PAP and routine  GYNE exam this year . Can be done here or  elsewhere Will notify you  of labs when available.  Will do a nutritional referral . And to Dr. Sharl Ma as requested.   Avoid   processed carbohydrates and added sugars to foods.  Avoid portion distortion. Get your sleep  Track intake and activity sleep level to help make changes.  Health Maintenance, Female Adopting a healthy lifestyle and getting preventive care are important in promoting health and wellness. Ask your health care provider about: The right schedule for you to have regular tests and exams. Things you can do on your own to prevent diseases and keep yourself healthy. What should I know about diet, weight, and exercise? Eat a healthy diet  Eat a diet that includes plenty of vegetables, fruits, low-fat dairy products, and lean protein. Do not eat a lot of foods that are high in solid fats, added sugars, or sodium.  Maintain a healthy weight Body mass index (BMI) is used to identify weight problems. It estimates body fat based on height and weight. Your health care provider can help determineyour BMI and help you achieve or maintain a healthy weight. Get regular exercise Get regular exercise. This is one of the most important things you can do for your health. Most adults should: Exercise for at least 150 minutes each week. The exercise should increase your heart rate and make you sweat (moderate-intensity exercise). Do strengthening exercises at least twice a week. This is in addition to the moderate-intensity exercise. Spend less time sitting. Even light physical activity can be beneficial. Watch cholesterol and blood lipids Have your blood tested for lipids and cholesterol at 21 years of age, then havethis test every 5 years. Have your cholesterol levels checked more often if: Your lipid or  cholesterol levels are high. You are older than 21 years of age. You are at high risk for heart disease. What should I know about cancer screening? Depending on your health history and family history, you may need to have cancer screening at various ages. This may include screening for: Breast cancer. Cervical cancer. Colorectal cancer. Skin cancer. Lung cancer. What should I know about heart disease, diabetes, and high blood pressure? Blood pressure and heart disease High blood pressure causes heart disease and increases the risk of stroke. This is more likely to develop in people who have high blood pressure readings, are of African descent, or are overweight. Have your blood pressure checked: Every 3-5 years if you are 59-56 years of age. Every year if you are 61 years old or older. Diabetes Have regular diabetes screenings. This checks your fasting blood sugar level. Have the screening done: Once every three years after age 71 if you are at a normal weight and have a low risk for diabetes. More often and at a younger age if you are overweight or have a high risk for diabetes. What should I know about preventing infection? Hepatitis B If you have a higher risk for hepatitis B, you should be screened for this virus. Talk with your health care provider to find out if you are at risk forhepatitis B infection. Hepatitis C Testing is recommended for: Everyone born from [birth year] through 1965. Anyone with known risk factors for hepatitis C. Sexually transmitted infections (STIs) Get screened for STIs,  including gonorrhea and chlamydia, if: You are sexually active and are younger than 21 years of age. You are older than 21 years of age and your health care provider tells you that you are at risk for this type of infection. Your sexual activity has changed since you were last screened, and you are at increased risk for chlamydia or gonorrhea. Ask your health care provider if you are at  risk. Ask your health care provider about whether you are at high risk for HIV. Your health care provider may recommend a prescription medicine to help prevent HIV infection. If you choose to take medicine to prevent HIV, you should first get tested for HIV. You should then be tested every 3 months for as long as you are taking the medicine. Pregnancy If you are about to stop having your period (premenopausal) and you may become pregnant, seek counseling before you get pregnant. Take 400 to 800 micrograms (mcg) of folic acid every day if you become pregnant. Ask for birth control (contraception) if you want to prevent pregnancy. Osteoporosis and menopause Osteoporosis is a disease in which the bones lose minerals and strength with aging. This can result in bone fractures. If you are 65 years old or older, or if you are at risk for osteoporosis and fractures, ask your health care provider if you should: Be screened for bone loss. Take a calcium or vitamin D supplement to lower your risk of fractures. Be given hormone replacement therapy (HRT) to treat symptoms of menopause. Follow these instructions at home: Lifestyle Do not use any products that contain nicotine or tobacco, such as cigarettes, e-cigarettes, and chewing tobacco. If you need help quitting, ask your health care provider. Do not use street drugs. Do not share needles. Ask your health care provider for help if you need support or information about quitting drugs. Alcohol use Do not drink alcohol if: Your health care provider tells you not to drink. You are pregnant, may be pregnant, or are planning to become pregnant. If you drink alcohol: Limit how much you use to 0-1 drink a day. Limit intake if you are breastfeeding. Be aware of how much alcohol is in your drink. In the [place] one drink equals one 12 oz bottle of beer (355 mL), one 5 oz glass of wine (148 mL), or one 1 oz glass of hard liquor (44 mL). General  instructions Schedule regular health, dental, and eye exams. Stay current with your vaccines. Tell your health care provider if: You often feel depressed. You have ever been abused or do not feel safe at home. Summary Adopting a healthy lifestyle and getting preventive care are important in promoting health and wellness. Follow your health care provider's instructions about healthy diet, exercising, and getting tested or screened for diseases. Follow your health care provider's instructions on monitoring your cholesterol and blood pressure. This information is not intended to replace advice given to you by your health care provider. Make sure you discuss any questions you have with your healthcare provider. Document Revised: 02/10/2018 Document Reviewed: 02/10/2018 Elsevier Patient Education  2022 [employer].

## 2020-08-14 LAB — HEPATITIS C ANTIBODY
Hepatitis C Ab: NONREACTIVE
SIGNAL TO CUT-OFF: 0 (ref <1.00)

## 2020-08-17 ENCOUNTER — Other Ambulatory Visit: Payer: Self-pay | Admitting: Internal Medicine

## 2020-08-17 ENCOUNTER — Other Ambulatory Visit (HOSPITAL_COMMUNITY): Payer: Self-pay

## 2020-08-17 MED ORDER — LEVOTHYROXINE SODIUM 75 MCG PO TABS
75.0000 ug | ORAL_TABLET | Freq: Every day | ORAL | 2 refills | Status: DC
  Filled 2020-08-17: qty 90, 90d supply, fill #0

## 2020-08-17 NOTE — Progress Notes (Signed)
Lab results  blood sugar blood count thyroid liver kidney are normal.  Cholesterol slightly up from last year but still favorable. Continue lifestyle intervention healthy eating and exercise .  For now I will send in same thyroid  dose  and will opine  to endocrine for dosage change .

## 2020-08-21 ENCOUNTER — Other Ambulatory Visit: Payer: Self-pay | Admitting: Internal Medicine

## 2020-08-21 ENCOUNTER — Other Ambulatory Visit: Payer: Self-pay | Admitting: Family Medicine

## 2020-08-22 ENCOUNTER — Other Ambulatory Visit (HOSPITAL_COMMUNITY): Payer: Self-pay

## 2020-08-22 MED ORDER — DESOGESTREL-ETHINYL ESTRADIOL 0.15-30 MG-MCG PO TABS
1.0000 | ORAL_TABLET | Freq: Every day | ORAL | 3 refills | Status: DC
  Filled 2020-08-22: qty 84, 84d supply, fill #0

## 2020-09-28 IMAGING — DX DG CHEST 2V
2 series · 2 of 2 positions shown · non-contrast
Comparison: None.

CLINICAL DATA: Right-sided upper back pain and chest pain.

EXAM:
CHEST - 2 VIEW

[w chest pa]
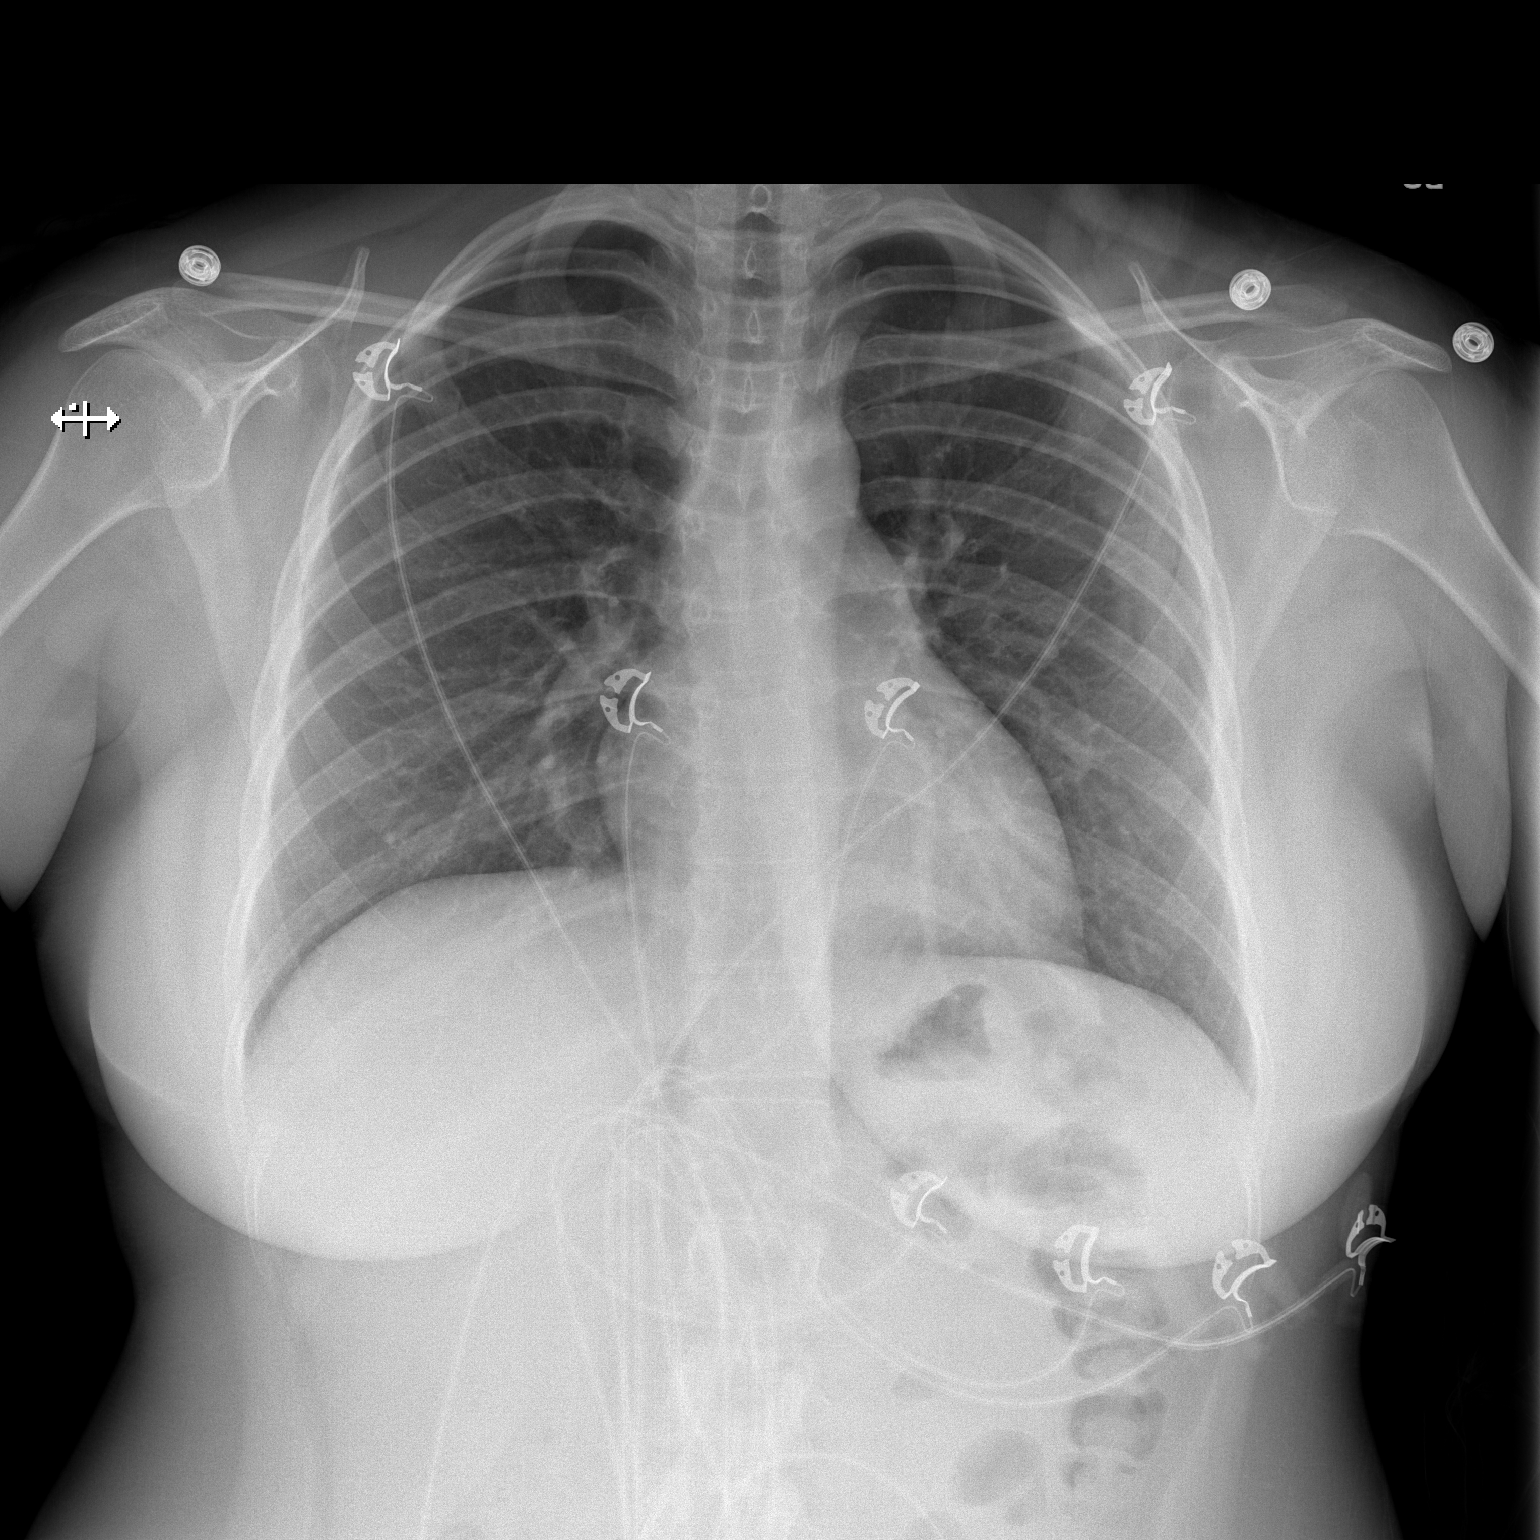

[w chest lat]
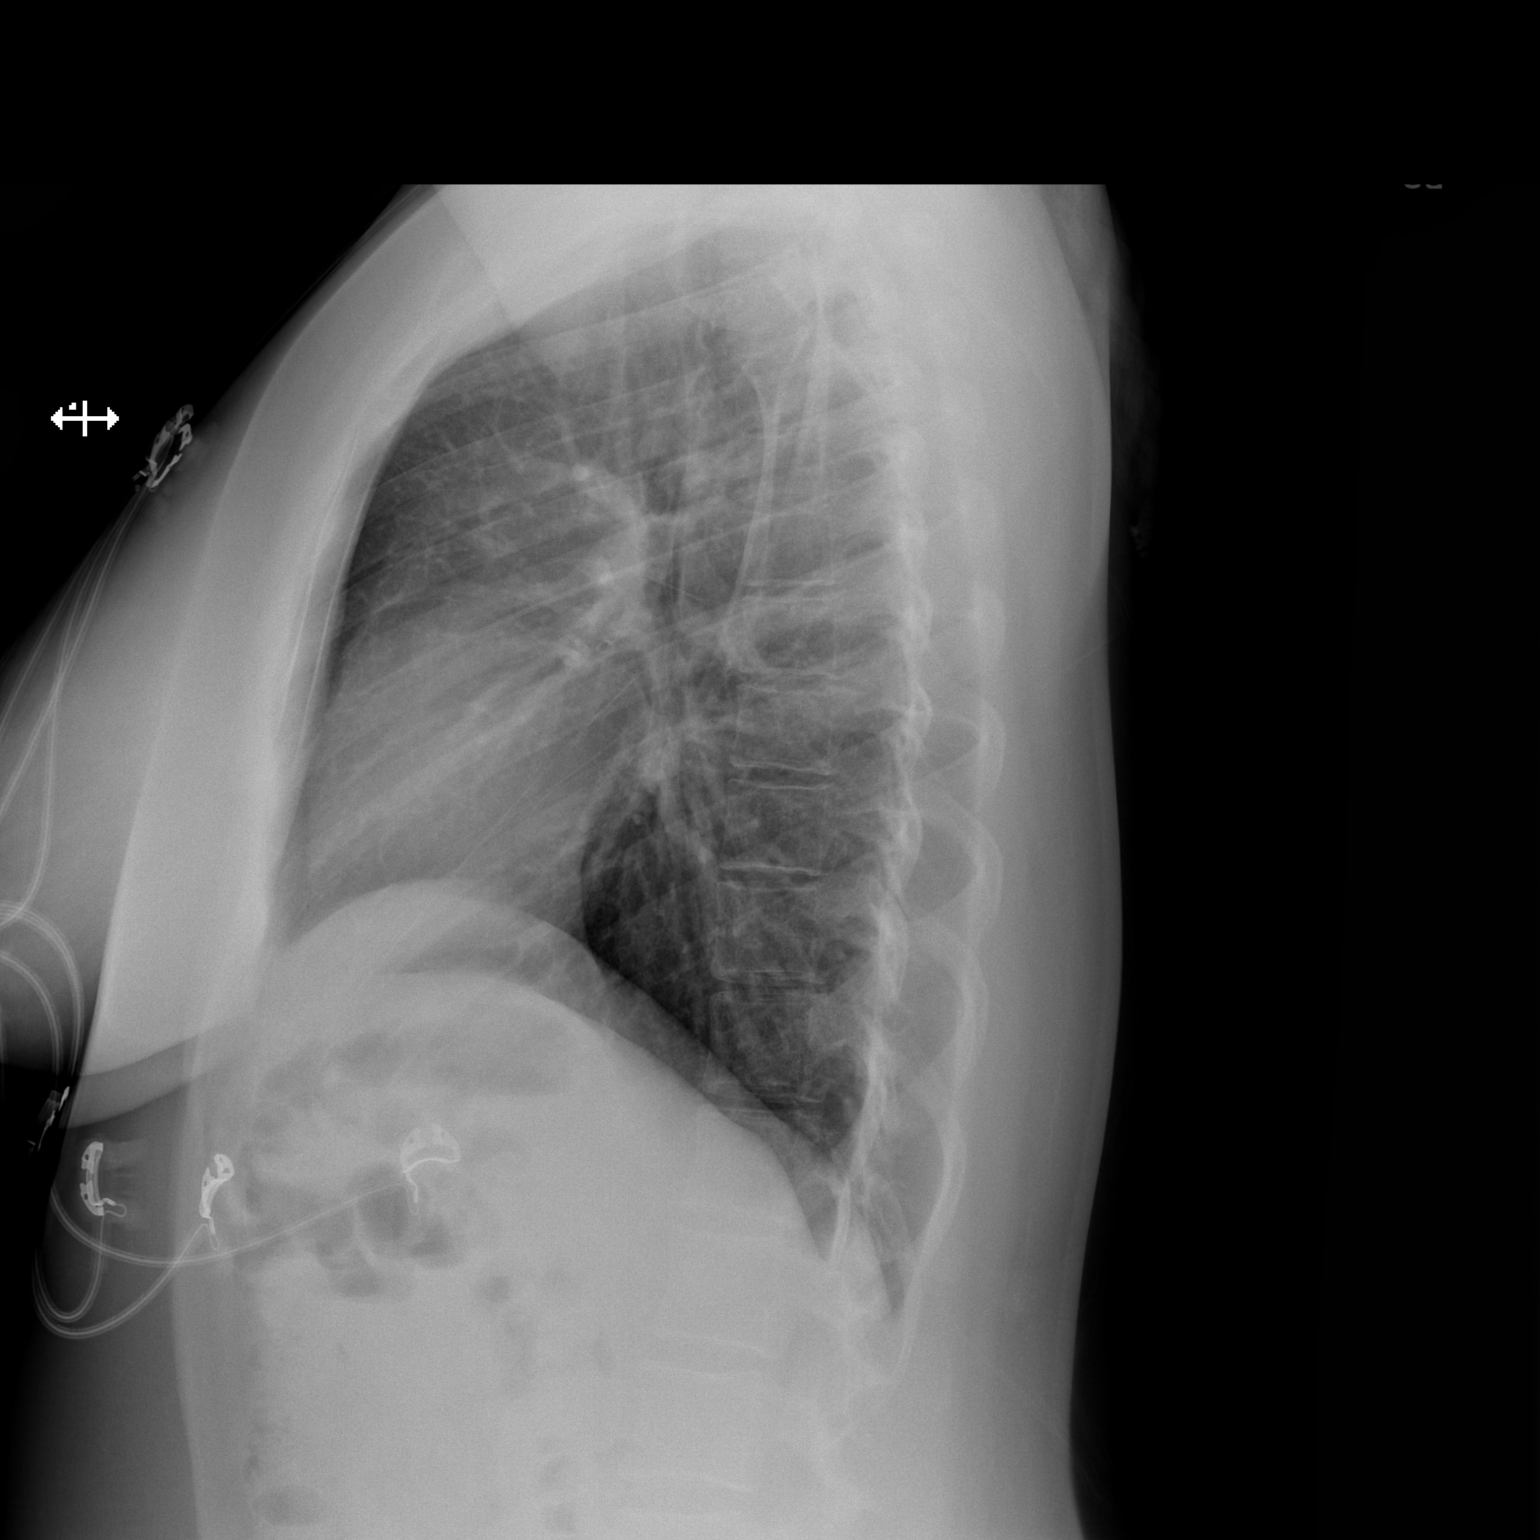

[2 of 2 positions shown; findings below may reference images not displayed]

FINDINGS: The heart size and mediastinal contours are within normal limits.
Both lungs are clear. The visualized skeletal structures are
unremarkable.
IMPRESSION: Normal exam.

## 2020-10-01 DIAGNOSIS — S50311A Abrasion of right elbow, initial encounter: Secondary | ICD-10-CM | POA: Diagnosis not present

## 2020-10-01 DIAGNOSIS — W010XXA Fall on same level from slipping, tripping and stumbling without subsequent striking against object, initial encounter: Secondary | ICD-10-CM | POA: Diagnosis not present

## 2020-11-13 MED ORDER — DESOGESTREL-ETHINYL ESTRADIOL 0.15-30 MG-MCG PO TABS: 1.0000 | ORAL_TABLET | Freq: Every day | ORAL | 3 refills | Status: DC

## 2020-11-13 MED ORDER — LEVOTHYROXINE SODIUM 75 MCG PO TABS: 75.0000 ug | ORAL_TABLET | Freq: Every day | ORAL | 2 refills | Status: DC

## 2021-01-28 ENCOUNTER — Encounter: Payer: Self-pay | Admitting: Internal Medicine

## 2021-01-28 DIAGNOSIS — E069 Thyroiditis, unspecified: Secondary | ICD-10-CM

## 2021-01-28 NOTE — Telephone Encounter (Signed)
Sure  please refer to dr Sharl Ma   for  [facility]  thyroiditis

## 2021-01-29 NOTE — Telephone Encounter (Signed)
Referral has been placed and pt is aware. 

## 2021-02-22 ENCOUNTER — Ambulatory Visit: Payer: 59 | Admitting: Dietician

## 2021-07-19 ENCOUNTER — Encounter: Payer: 59 | Attending: Internal Medicine | Admitting: Registered"

## 2021-07-19 ENCOUNTER — Encounter: Payer: Self-pay | Admitting: Registered"

## 2021-07-19 ENCOUNTER — Other Ambulatory Visit (HOSPITAL_COMMUNITY): Payer: Self-pay

## 2021-07-19 VITALS — Ht 62.0 in | Wt 215.8 lb

## 2021-07-19 DIAGNOSIS — E063 Autoimmune thyroiditis: Secondary | ICD-10-CM | POA: Insufficient documentation

## 2021-07-19 DIAGNOSIS — E669 Obesity, unspecified: Secondary | ICD-10-CM | POA: Insufficient documentation

## 2021-07-19 DIAGNOSIS — Z6839 Body mass index (BMI) 39.0-39.9, adult: Secondary | ICD-10-CM | POA: Diagnosis not present

## 2021-07-19 DIAGNOSIS — R635 Abnormal weight gain: Secondary | ICD-10-CM | POA: Diagnosis not present

## 2021-07-19 DIAGNOSIS — R5383 Other fatigue: Secondary | ICD-10-CM | POA: Diagnosis not present

## 2021-07-19 DIAGNOSIS — E038 Other specified hypothyroidism: Secondary | ICD-10-CM | POA: Diagnosis not present

## 2021-07-19 DIAGNOSIS — Z713 Dietary counseling and surveillance: Secondary | ICD-10-CM | POA: Insufficient documentation

## 2021-07-19 DIAGNOSIS — R140 Abdominal distension (gaseous): Secondary | ICD-10-CM | POA: Diagnosis not present

## 2021-07-19 MED ORDER — DEXAMETHASONE 1 MG PO TABS
1.0000 mg | ORAL_TABLET | ORAL | 0 refills | Status: DC
  Filled 2021-07-19: qty 1, 1d supply, fill #0

## 2021-07-19 NOTE — Patient Instructions (Signed)
Eat dinner by 7 pm, try eating mindfully Eat a balanced meal: one example Salmon, zucchini or asparagus, rice or potato. Clean up the kitchen, Read 9 pm start getting ready for bed, with a goal of being asleep by 10 pm  When you start getting hungry for breakfast make sure you include protein.

## 2021-07-19 NOTE — Progress Notes (Signed)
Medical Nutrition Therapy  Appointment Start time:  [phone]  Appointment End time:  0910  Primary concerns today: unintended weight gain, relationship with food  Referral diagnosis: E06.3 (ICD-10-CM) - Hashimoto's disease  R63.5 (ICD-10-CM) - Weight gain Preferred learning style: no preference indicated Learning readiness: ready  NUTRITION ASSESSMENT  Anthropometrics  07/19/21 215 lb 12.8 oz (97.9 kg)   Clinical Medical Hx: reviewed Medications: levothyroxine Labs: A1c 5.1%; HDL 98.1 Notable Signs/Symptoms: weight gain, increased fatigue  Lifestyle & Dietary Hx Pt states she hasn't had period since stopping birth control 2 months ago, prior to birth control had regular menstrual  cycle.   Pt reports Dx thyroid disease 4 yrs ago; started gaining weight quickly at that time.  Sleep: stays up late has a hard time falling asleep. Pt reports she stays up watching TV and has to get up early for work.   Diet: Gets up 6-8 am first meal around noon. While eating dinner watches TV and either on couch or table. Sometimes. Lunch when working from home will take a break, when at the office will eat while working (2-3x/week).  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 Pt reports she enjoys her work but it is one of the main contributor to stress.  Physical activity: may skip a week but usually 5x/week at gym  Estimated daily fluid intake: "a lot" a few big refillable bottles Supplements: none Sleep: 5-7 hrs  Stress / self-care: 7/10 - work, weight gain Current average weekly physical activity: 5x/week gym 1-2 hrs; treadmill, strenghth, dance classes or HIT classes.  24-Hr Dietary Recall First Meal:  Snack: sometimes protein bar OR iced coffee Second Meal: wrap or a bowl OR leftovers Snack: chips OR fruit Third Meal: chicken, rice, salad, tizi cucumbers, tomatoes, Snack: none  Beverages: water, goes out for drinks with friends sometimes  NUTRITION DIAGNOSIS  NB-1.1 Food  and nutrition-related knowledge deficit As related to timing and dinner meal size affecting sleep and stress levels.  As evidenced by pt states dinner is largest meal, eats snack foods during hours when would benefit from being asleep.  NUTRITION INTERVENTION  Nutrition education (E-1) on the following topics:  Stress and affect on ability to implement healthy habits Sleep role in stress management and health Timing and size of dinner  Handouts Provided Include  Dish up a Healthy Meal  Learning Style & Readiness for Change Teaching method utilized: Visual & Auditory  Demonstrated degree of understanding via: Teach Back  Barriers to learning/adherence to lifestyle change: none  Goals Established by Pt Eat dinner by 7 pm, try eating mindfully Eat a balanced meal: one example Salmon, zucchini or asparagus, rice or potato. Clean up the kitchen, Read 9 pm start getting ready for bed, with a goal of being asleep by 10 pm When you start getting hungry for breakfast make sure you include protein.  MONITORING & EVALUATION Dietary intake, weekly physical activity, and menstrual energy/stress levels in 2 months.

## 2021-07-22 ENCOUNTER — Other Ambulatory Visit (HOSPITAL_COMMUNITY): Payer: Self-pay

## 2021-07-23 DIAGNOSIS — R635 Abnormal weight gain: Secondary | ICD-10-CM | POA: Diagnosis not present

## 2021-07-23 DIAGNOSIS — E669 Obesity, unspecified: Secondary | ICD-10-CM | POA: Diagnosis not present

## 2021-07-25 ENCOUNTER — Other Ambulatory Visit (HOSPITAL_COMMUNITY): Payer: Self-pay

## 2021-07-25 DIAGNOSIS — E669 Obesity, unspecified: Secondary | ICD-10-CM | POA: Diagnosis not present

## 2021-07-25 DIAGNOSIS — E038 Other specified hypothyroidism: Secondary | ICD-10-CM | POA: Diagnosis not present

## 2021-07-25 DIAGNOSIS — E063 Autoimmune thyroiditis: Secondary | ICD-10-CM | POA: Diagnosis not present

## 2021-07-25 MED ORDER — SYNTHROID 88 MCG PO TABS
88.0000 ug | ORAL_TABLET | Freq: Every morning | ORAL | 4 refills | Status: DC
  Filled 2021-07-25: qty 90, 90d supply, fill #0

## 2021-07-25 MED ORDER — QSYMIA 3.75-23 MG PO CP24
1.0000 | ORAL_CAPSULE | Freq: Every day | ORAL | 0 refills | Status: DC
  Filled 2021-07-25 (×2): qty 14, 14d supply, fill #0

## 2021-07-25 MED ORDER — QSYMIA 7.5-46 MG PO CP24
1.0000 | ORAL_CAPSULE | Freq: Every day | ORAL | 1 refills | Status: DC
Start: 2021-07-25 — End: 2022-05-02
  Filled 2021-07-25 – 2021-07-30 (×2): qty 90, 90d supply, fill #0
  Filled 2021-08-02: qty 30, 30d supply, fill #0

## 2021-07-30 ENCOUNTER — Other Ambulatory Visit (HOSPITAL_COMMUNITY): Payer: Self-pay

## 2021-08-02 ENCOUNTER — Other Ambulatory Visit (HOSPITAL_COMMUNITY): Payer: Self-pay

## 2021-08-30 ENCOUNTER — Other Ambulatory Visit: Payer: Self-pay | Admitting: Internal Medicine

## 2021-09-18 ENCOUNTER — Telehealth: Payer: Self-pay | Admitting: Internal Medicine

## 2021-09-18 ENCOUNTER — Other Ambulatory Visit: Payer: Self-pay

## 2021-09-18 DIAGNOSIS — E063 Autoimmune thyroiditis: Secondary | ICD-10-CM

## 2021-09-18 DIAGNOSIS — R635 Abnormal weight gain: Secondary | ICD-10-CM

## 2021-09-18 NOTE — Telephone Encounter (Signed)
Please re refer . As above

## 2021-09-18 NOTE — Telephone Encounter (Signed)
Rachel Skinner with [facility] is calling and the pt referral has expired. Pt needs new referral for nutrition and Hashimoto disease cpt code E06.3 and R63.5

## 2021-09-18 NOTE — Telephone Encounter (Signed)
Last Ov 08/13/20 Please advise

## 2021-09-19 NOTE — Telephone Encounter (Signed)
Referral placed.

## 2021-09-26 DIAGNOSIS — E063 Autoimmune thyroiditis: Secondary | ICD-10-CM | POA: Diagnosis not present

## 2021-10-15 ENCOUNTER — Ambulatory Visit (INDEPENDENT_AMBULATORY_CARE_PROVIDER_SITE_OTHER): Payer: 59 | Admitting: Internal Medicine

## 2021-10-15 ENCOUNTER — Encounter: Payer: Self-pay | Admitting: Internal Medicine

## 2021-10-15 ENCOUNTER — Other Ambulatory Visit (HOSPITAL_COMMUNITY): Payer: Self-pay

## 2021-10-15 VITALS — BP 140/112 | HR 135 | Temp 98.4°F | Ht 61.75 in | Wt 196.2 lb

## 2021-10-15 DIAGNOSIS — Z13220 Encounter for screening for lipoid disorders: Secondary | ICD-10-CM | POA: Diagnosis not present

## 2021-10-15 DIAGNOSIS — L680 Hirsutism: Secondary | ICD-10-CM | POA: Diagnosis not present

## 2021-10-15 DIAGNOSIS — N912 Amenorrhea, unspecified: Secondary | ICD-10-CM | POA: Diagnosis not present

## 2021-10-15 DIAGNOSIS — Z79899 Other long term (current) drug therapy: Secondary | ICD-10-CM | POA: Diagnosis not present

## 2021-10-15 DIAGNOSIS — E063 Autoimmune thyroiditis: Secondary | ICD-10-CM | POA: Diagnosis not present

## 2021-10-15 DIAGNOSIS — R030 Elevated blood-pressure reading, without diagnosis of hypertension: Secondary | ICD-10-CM

## 2021-10-15 DIAGNOSIS — Z0000 Encounter for general adult medical examination without abnormal findings: Secondary | ICD-10-CM | POA: Diagnosis not present

## 2021-10-15 DIAGNOSIS — G479 Sleep disorder, unspecified: Secondary | ICD-10-CM

## 2021-10-15 DIAGNOSIS — E038 Other specified hypothyroidism: Secondary | ICD-10-CM | POA: Diagnosis not present

## 2021-10-15 DIAGNOSIS — R635 Abnormal weight gain: Secondary | ICD-10-CM

## 2021-10-15 DIAGNOSIS — N911 Secondary amenorrhea: Secondary | ICD-10-CM | POA: Diagnosis not present

## 2021-10-15 DIAGNOSIS — E669 Obesity, unspecified: Secondary | ICD-10-CM | POA: Diagnosis not present

## 2021-10-15 DIAGNOSIS — Z23 Encounter for immunization: Secondary | ICD-10-CM | POA: Diagnosis not present

## 2021-10-15 DIAGNOSIS — F419 Anxiety disorder, unspecified: Secondary | ICD-10-CM

## 2021-10-15 DIAGNOSIS — F411 Generalized anxiety disorder: Secondary | ICD-10-CM | POA: Diagnosis not present

## 2021-10-15 DIAGNOSIS — L709 Acne, unspecified: Secondary | ICD-10-CM | POA: Diagnosis not present

## 2021-10-15 LAB — CBC WITH DIFFERENTIAL/PLATELET
Basophils Absolute: 0 10*3/uL (ref 0.0–0.1)
Basophils Relative: 0.5 % (ref 0.0–3.0)
Eosinophils Absolute: 0 10*3/uL (ref 0.0–0.7)
Eosinophils Relative: 0.5 % (ref 0.0–5.0)
HCT: 45 % (ref 36.0–46.0)
Hemoglobin: 14.9 g/dL (ref 12.0–15.0)
Lymphocytes Relative: 23.7 % (ref 12.0–46.0)
Lymphs Abs: 1.8 10*3/uL (ref 0.7–4.0)
MCHC: 33.1 g/dL (ref 30.0–36.0)
MCV: 89.7 fl (ref 78.0–100.0)
Monocytes Absolute: 0.7 10*3/uL (ref 0.1–1.0)
Monocytes Relative: 8.8 % (ref 3.0–12.0)
Neutro Abs: 5 10*3/uL (ref 1.4–7.7)
Neutrophils Relative %: 66.5 % (ref 43.0–77.0)
Platelets: 306 10*3/uL (ref 150.0–400.0)
RBC: 5.02 Mil/uL (ref 3.87–5.11)
RDW: 14.1 % (ref 11.5–15.5)
WBC: 7.6 10*3/uL (ref 4.0–10.5)

## 2021-10-15 LAB — BASIC METABOLIC PANEL
BUN: 11 mg/dL (ref 6–23)
CO2: 22 mEq/L (ref 19–32)
Calcium: 10.4 mg/dL (ref 8.4–10.5)
Chloride: 103 mEq/L (ref 96–112)
Creatinine, Ser: 0.95 mg/dL (ref 0.40–1.20)
GFR: 85.21 mL/min (ref >60.00)
Glucose, Bld: 86 mg/dL (ref 70–99)
Potassium: 3.8 mEq/L (ref 3.5–5.1)
Sodium: 139 mEq/L (ref 135–145)

## 2021-10-15 LAB — HEMOGLOBIN A1C: Hgb A1c MFr Bld: 5.6 % (ref 4.6–6.5)

## 2021-10-15 LAB — HEPATIC FUNCTION PANEL
ALT: 69 U/L — ABNORMAL HIGH (ref 0–35)
AST: 58 U/L — ABNORMAL HIGH (ref 0–37)
Albumin: 4.8 g/dL (ref 3.5–5.2)
Alkaline Phosphatase: 62 U/L (ref 39–117)
Bilirubin, Direct: 0.2 mg/dL (ref 0.0–0.3)
Total Bilirubin: 1.1 mg/dL (ref 0.2–1.2)
Total Protein: 8.4 g/dL — ABNORMAL HIGH (ref 6.0–8.3)

## 2021-10-15 LAB — LIPID PANEL
Cholesterol: 183 mg/dL (ref 0–200)
HDL: 94.5 mg/dL (ref >39.00)
LDL Cholesterol: 70 mg/dL (ref 0–99)
NonHDL: 88.3
Total CHOL/HDL Ratio: 2
Triglycerides: 91 mg/dL (ref 0.0–149.0)
VLDL: 18.2 mg/dL (ref 0.0–40.0)

## 2021-10-15 LAB — TESTOSTERONE: Testosterone: 102

## 2021-10-15 MED ORDER — SYNTHROID 88 MCG PO TABS
88.0000 ug | ORAL_TABLET | Freq: Every morning | ORAL | 4 refills | Status: AC
  Filled 2021-10-15: qty 90, 90d supply, fill #0
  Filled 2022-03-28: qty 90, 90d supply, fill #1

## 2021-10-15 NOTE — Patient Instructions (Addendum)
Advise counseling   regarding stress and anxiety with job and effecting  sleep etc.  LEt us know if we can help . Sometime adding medication can help .  Can try hydroxyzine at night  but can cause drowsiness in am  . Let us know if you want to try .( Will have to check if interact with other meds )  Lab today a s planned  Will do gyne referral     as planned about the lack of periods and  pap etc.   The weight changes  other causes can be treated and evaluation.   Bp is somewhat up today could be from in office  issue  ( healthy weight loss can also help this)   Please bring your blood pressure cuff to next appointment Take blood pressure readings twice a day for 5-7  days and record .     Take 2 -3 readings at each sitting .   Can send in readings  by My Chart.     Before checking your blood pressure make sure: You are seated and quite for 5 min before checking Feet are flat on the floor Siting in chair with your back supported straight up and down Arm resting on table or arm of chair at heart level Bladder is empty You have NOT had caffeine or tobacco within the last 30 min  [email or web address]

## 2021-10-15 NOTE — Progress Notes (Signed)
Chief Complaint  Patient presents with   Annual Exam    HPI: Patient  Rachel Skinner  21 y.o. comes in today for Preventive Health Care visit   Ocp: She stopped the OCPs about 3 to 4 months ago because of no need to use for contraception previously periods stayed about 3 days denied any kind of significant side effects Since she stopped she has had no period for 3 months.  Her endocrinologist Dr. Sharl Ma has done lab work pending prolactin testosterone estradiol TSH hcg pending for evaluation consideration of PCOS Her dosage of thyroid medication was changed in May.  She gained a great deal of weight after she started her new job last summer in [place] has been stressful most recently has been given weight management information and medication   qysmia   so far has lost 18 # better attention to eating healthy drinking mostly water  Or mood eating Stress shows with anxiety mostly related to her work insomnia sleeps sometimes only a few hours. Is doing more exercise.  No SA last time over 6 months ago no symptoms.  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  No exercise-induced cardiovascular symptoms.   Past  Medical History:  Diagnosis Date   Hashimoto's disease    Infected pilonidal cyst 03/04/2013   Pilonidal cyst 01/2013   is open and draining   Pilonidal sinus with abscess 01/31/2013   draining   no mass effect  empriric antibiotic cause of sx persistent and tender  fu in 2-3 weeks       Past Surgical History:  Procedure Laterality Date   PILONIDAL CYST EXCISION N/A 03/04/2013   Procedure: EXCISION PILONIDAL CYST PEDIATRIC;  Surgeon: Judie Petit. Leonia Corona, MD;  Location: [facility];  Service: Pediatrics;  Laterality: N/A;  pilonidal area    Family History  Problem Relation Age of Onset   Heart disease Maternal Grandfather        possible MI   Thyroid disease Maternal Grandmother     Social History   Socioeconomic History   Marital status: Single    Spouse name: Not on file   Number of children: Not on file   Years of education: Not on file   Highest education level: Not on file  Occupational History   Not on file  Tobacco Use   Smoking status: Never   Smokeless tobacco: Never  Vaping Use   Vaping Use: Never used  Substance and Sexual Activity   Alcohol use: No   Drug use: No   Sexual activity: Never  Other Topics Concern   Not on file  Social History Narrative  Social Determinants of Health   Financial Resource Strain: Not on file  Food Insecurity: No Food Insecurity (07/19/2021)   Hunger Vital Sign    Worried About Running Out of Food in the Last Year: Never true    Ran Out of Food in the Last Year: Never true  Transportation Needs: Not on file  Physical Activity: Not on file  Stress: Not on file  Social Connections: Not on file    Outpatient Medications Prior to Visit  Medication Sig Dispense Refill   Phentermine-Topiramate (QSYMIA) 3.75-23 MG CP24 Take 1 capsule by mouth daily. 14 capsule 0   Phentermine-Topiramate (QSYMIA) 7.5-46 MG CP24 Take 1 capsule by mouth daily. (after 14 days of starter dose) 90 capsule 1   SYNTHROID  88 MCG tablet Take 1 tablet (88 mcg total) by mouth in the morning on an empty stomach. 90 tablet 4   SYNTHROID 88 MCG tablet Take 1 tablet (88 mcg total) by mouth in the morning on an empty stomach 90 tablet 4   ibuprofen (ADVIL) 200 MG tablet Take 400 mg by mouth every 6 (six) hours as needed for moderate pain. (Patient not taking: Reported on 10/15/2021)     acetaminophen (TYLENOL) 500 MG tablet Take 1,000 mg by mouth every 6 (six) hours as needed for mild pain. (Patient not taking: Reported on 10/15/2021)     cyclobenzaprine (FLEXERIL) 10 MG tablet Take 1 tablet (10 mg total) by mouth 2 (two) times daily as needed for muscle spasms. (Patient not taking: Reported on 07/19/2021) 20 tablet 0   desogestrel-ethinyl estradiol (APRI) 0.15-30 MG-MCG tablet Take 1 tablet by mouth daily. (Patient not taking: Reported on 07/19/2021) 84 tablet 3   dexamethasone (DECADRON) 1 MG tablet Take 1 tablet (1 mg total) by mouth once at 11pm before lab test at 8am for 1 day (Patient not taking: Reported on 10/15/2021) 1 tablet 0   levothyroxine (SYNTHROID) 75 MCG tablet TAKE 1 TABLET BY MOUTH EVERY DAY (Patient not taking: Reported on 10/15/2021) 90 tablet 2   No facility-administered medications prior to visit.     EXAM:  BP (!) 140/112 (BP Location: Right Arm, Cuff Size: Normal)   Pulse (!) 135   Temp 98.4 F (36.9 C) (Oral)   Ht 5' 1.75" (1.568 m)   Wt 196 lb 3.2 oz (89 kg)   LMP 05/03/2021 (Approximate)   SpO2 98%   BMI 36.18 kg/m   Body mass index is 36.18 kg/m. Wt Readings from Last 3 Encounters:  10/15/21 196 lb 3.2 oz (89 kg)  07/19/21 215 lb 12.8 oz (97.9 kg)  08/13/20 182 lb 3.2 oz (82.6 kg)    BP Readings from Last 3 Encounters:  10/15/21 (!) 140/112  08/13/20 130/88  06/17/19 122/78     Physical Exam: Vital signs reviewed QQP:YPPJ is a well-developed well-nourished alert cooperative    who appearsr stated age in no acute distress.  HEENT: normocephalic atraumatic , Eyes: PERRL EOM's  full, conjunctiva clear, Nares: paten,t no deformity discharge or tenderness., Ears: no deformity EAC's clear TMs with normal landmarks. Mouth: clear OP, no lesions, edema.  Moist mucous membranes. Dentition in adequate repair. NECK: supple without masses, tor bruits. Thyroid palpable  CHEST/PULM:  Clear to auscultation and percussion breath sounds equal no wheeze , rales or rhonchi. No chest wall deformities or tenderness. Breast: normal by inspection . No dimpling, discharge, masses, tenderness or discharge . CV: PMI is nondisplaced, S1 S2 no gallops, murmurs, rubs. Peripheral pulses are full without delay.No  JVD .  ABDOMEN: Bowel sounds normal nontender  No guard or rebound, no hepato splenomegal no CVA tenderness.   Extremtities:  No clubbing cyanosis or edema, no acute joint swelling or redness no focal atrophy NEURO:  Oriented x3, cranial nerves 3-12 appear to be intact, no obvious focal weakness,gait within normal limits no abnormal reflexes or asymmetrical SKIN: No acute rashes normal turgor, color, no bruising or petechiae. Nl body hair  no striae or excess acne PSYCH: Oriented, good eye contact, no obvious depression anxiety, cognition and judgment appear normal. LN: no cervical axillary inguinal adenopathy See screening     BP Readings from Last 3 Encounters:  10/15/21 (!) 140/112  08/13/20 130/88  06/17/19 122/78    Lab plan reviewed with patient  Lab Results  Component Value Date   WBC 7.6 10/15/2021   HGB 14.9 10/15/2021   HCT 45.0 10/15/2021   PLT 306.0 10/15/2021   GLUCOSE 86 10/15/2021   CHOL 183 10/15/2021   TRIG 91.0 10/15/2021   HDL 94.50 10/15/2021   LDLCALC 70 10/15/2021   ALT 69 (H) 10/15/2021   AST 58 (H) 10/15/2021   NA 139 10/15/2021   K 3.8 10/15/2021   CL 103 10/15/2021   CREATININE 0.95 10/15/2021   BUN 11 10/15/2021   CO2 22 10/15/2021   TSH 3.82 08/13/2020   HGBA1C 5.6 10/15/2021    ASSESSMENT AND PLAN:  Discussed the following assessment  and plan:    ICD-10-CM   1. Visit for preventive health examination  Z00.00 Basic metabolic panel    Hepatic function panel    CBC with Differential/Platelet    Lipid panel    Hemoglobin A1c    Hemoglobin A1c    Lipid panel    CBC with Differential/Platelet    Hepatic function panel    Basic metabolic panel   due for pap wants to wait and see gyne    2. Hashimoto's disease  E06.3 Basic metabolic panel    Hepatic function panel    CBC with Differential/Platelet    Lipid panel    Hemoglobin A1c    Hemoglobin A1c    Lipid panel    CBC with Differential/Platelet    Hepatic function panel    Basic metabolic panel    3. Medication management  Z79.899 Basic metabolic panel    Hepatic function panel    CBC with Differential/Platelet    Lipid panel    Hemoglobin A1c    Hemoglobin A1c    Lipid panel    CBC with Differential/Platelet    Hepatic function panel    Basic metabolic panel    4. Amenorrhea  N91.2 Hemoglobin A1c    Hemoglobin A1c    Ambulatory referral to Obstetrics / Gynecology   x 3 mos  see text    5. Screening, lipid  Z13.220 Basic metabolic panel    Hepatic function panel    CBC with Differential/Platelet    Lipid panel    Hemoglobin A1c    Hemoglobin A1c    Lipid panel    CBC with Differential/Platelet    Hepatic function panel    Basic metabolic panel    6. Sleep disturbance  G47.9 Basic metabolic panel    Hepatic function panel    CBC with Differential/Platelet    Lipid panel    Hemoglobin A1c    Hemoglobin A1c    Lipid panel    CBC with Differential/Platelet    Hepatic function panel    Basic metabolic  panel    7. Weight gain  R63.5 Hemoglobin A1c    Hemoglobin A1c    8. Elevated blood pressure reading  R03.0 Hemoglobin A1c    Hemoglobin A1c    9. Need for tetanus, diphtheria, and acellular pertussis (Tdap) vaccine  Z23 Tdap vaccine greater than or equal to 7yo IM    10. Anxiety  F41.9     Suspect amenorrhea is post OCP and related to  weight gain could have underlying PCOS or other Blood pressure readings need to be verified if they are indeed up on a regular basis Discussed options advise counseling in regard to mood issues anxiety that seem to stem from her work situation has affected her health consideration of medication but would expect counseling to be best first Other labs today she is fasting midday Plan follow-up virtual in about a month to address progress of all of these issues.  Earlier if she is having more difficulty. Return for send in BP readings in 3-4 weeks , can do fu virtual visit . see how doing otherwise .  Patient Care Team: Shandon Burlingame, Neta Mends, MD as PCP - General (Internal Medicine) Patient Instructions  Advise counseling   regarding stress and anxiety with job and effecting  sleep etc.  LEt us know if we can help . Sometime adding medication can help .  Can try hydroxyzine at night  but can cause drowsiness in am  . Let us know if you want to try .( Will have to check if interact with other meds )  Lab today a s planned  Will do gyne referral     as planned about the lack of periods and  pap etc.   The weight changes  other causes can be treated and evaluation.   Bp is somewhat up today could be from in office  issue  ( healthy weight loss can also help this)   Please bring your blood pressure cuff to next appointment Take blood pressure readings twice a day for 5-7  days and record .     Take 2 -3 readings at each sitting .   Can send in readings  by My Chart.     Before checking your blood pressure make sure: You are seated and quite for 5 min before checking Feet are flat on the floor Siting in chair with your back supported straight up and down Arm resting on table or arm of chair at heart level Bladder is empty You have NOT had caffeine or tobacco within the last 30 min  [email or web address]        Brax Walen K. Riki Berninger M.D.

## 2021-10-22 NOTE — Progress Notes (Signed)
Cholesterol and blood sugar are good however liver function tests are abnormal. Sometimes this can be from an acute illness medicine, fatty infiltration in liver.that can be helped by healthy weight loss and eating.   Advise avoid Tylenol alcohol Advil Aleve type products LFTs. Hep c aby, hep b ag hep bs aby  in about 1 month.

## 2021-10-28 ENCOUNTER — Encounter: Payer: 59 | Attending: Internal Medicine | Admitting: Registered"

## 2021-10-28 ENCOUNTER — Encounter: Payer: Self-pay | Admitting: Registered"

## 2021-10-28 DIAGNOSIS — E669 Obesity, unspecified: Secondary | ICD-10-CM | POA: Insufficient documentation

## 2021-10-28 NOTE — Progress Notes (Signed)
Virtual Visit via Video Note  I connected with Rachel Skinner on 10/28/21 at  4:45 PM EDT by a video enabled telemedicine application and verified that I am speaking with the correct person using two identifiers.  Location: Patient: home in [place] Provider: [facility]   I discussed the limitations of evaluation and management by telemedicine and the availability of in person appointments. The patient expressed understanding and agreed to proceed.  Pt states she has lost some weight and made some progress with eating healthy, but not be able to maintain for more than a week at a time and then will eat junk food for a couple of days. Pt states she enjoys chips and avoids to help her lose weight.  Pt states when she is busy it helps because she has to plan ahead and no time to snack. However if she is too busy, may not have time to prepare food ahead.  Pt states she eats breakfast mostly on weekends, things like eggs, avocado toast. During the week it may just be something small like a protein bar or skips.  Pt states some evenings she may eat too much, thinks probably due to stress. Pt reports she has not binged eat at night in the past 2 months. Pt reports she is planning on talking to a therapist to work on stress issues.   Goals Established by Pt Goals from last visit: Eat dinner by 7 pm, try eating mindfully  Has met ~0.75%, if eating out with friends will be later Eat balanced meals  When plans ahead does well with this goal 9 pm start getting ready for bed, with a goal of being asleep by 10 pm  Still working on this, work stress may be contributing to not enough restful sleep.  When you start getting hungry for breakfast make sure you include protein.  Pt does better on weekends with this goal  New goals: Okay to balance vegetables and having treats, not choosing one or the other. Great that you enjoy putting meals together, don't feel guilty for pre-made meals for  convenience. Reading in the evening, or something artistic to help you de-stress before bed.  Follow Up Instructions: Pt to continue working on building healthy habits (balanced meals and exercise) without having a restrictive mindset that leads to guilt.  Pt will call when she feels the need for a follow-up visit.  I discussed the assessment and treatment plan with the patient. The patient was provided an opportunity to ask questions and all were answered. The patient agreed with the plan and demonstrated an understanding of the instructions.   The patient was advised to call back or seek an in-person evaluation if the symptoms worsen or if the condition fails to improve as anticipated.  I provided 28 minutes of non-face-to-face time during this encounter.  Levie Heritage, RD, LDN, CDCES

## 2021-10-28 NOTE — Patient Instructions (Addendum)
Okay to balance vegetables and having treats, not choosing one or the other. Great that you enjoy putting meals together, don't feel guilty for pre-made meals for convenience. Reading in the evening, or something artistic to help you de-stress before bed.

## 2021-10-31 ENCOUNTER — Other Ambulatory Visit: Payer: Self-pay | Admitting: Internal Medicine

## 2021-10-31 DIAGNOSIS — L680 Hirsutism: Secondary | ICD-10-CM

## 2021-10-31 DIAGNOSIS — N911 Secondary amenorrhea: Secondary | ICD-10-CM

## 2021-10-31 DIAGNOSIS — L709 Acne, unspecified: Secondary | ICD-10-CM

## 2021-10-31 DIAGNOSIS — R7989 Other specified abnormal findings of blood chemistry: Secondary | ICD-10-CM

## 2021-11-05 ENCOUNTER — Other Ambulatory Visit: Payer: Self-pay

## 2021-11-05 DIAGNOSIS — R7989 Other specified abnormal findings of blood chemistry: Secondary | ICD-10-CM

## 2021-11-15 ENCOUNTER — Other Ambulatory Visit: Payer: Self-pay

## 2021-11-15 ENCOUNTER — Other Ambulatory Visit: Payer: 59

## 2021-11-15 ENCOUNTER — Ambulatory Visit
Admission: RE | Admit: 2021-11-15 | Discharge: 2021-11-15 | Disposition: A | Discharge status: Home / Self Care | Payer: 59 | Source: Ambulatory Visit | Attending: Internal Medicine | Admitting: Internal Medicine

## 2021-11-15 DIAGNOSIS — L680 Hirsutism: Secondary | ICD-10-CM

## 2021-11-15 DIAGNOSIS — R7989 Other specified abnormal findings of blood chemistry: Secondary | ICD-10-CM | POA: Diagnosis not present

## 2021-11-15 DIAGNOSIS — L709 Acne, unspecified: Secondary | ICD-10-CM

## 2021-11-15 DIAGNOSIS — N911 Secondary amenorrhea: Secondary | ICD-10-CM

## 2021-11-15 DIAGNOSIS — Z79899 Other long term (current) drug therapy: Secondary | ICD-10-CM

## 2021-11-19 LAB — HEPATITIS B SURFACE ANTIBODY,QUALITATIVE: Hep B S Ab: NONREACTIVE

## 2021-11-19 LAB — HEPATITIS C ANTIBODY: Hepatitis C Ab: NONREACTIVE

## 2021-11-19 LAB — HEPATITIS B SURFACE ANTIGEN: Hepatitis B Surface Ag: NONREACTIVE

## 2021-11-23 NOTE — Progress Notes (Signed)
So the hepatitis screen was negative but the repeat liver panel was not performed.  So I cannot tell if the levels have normalized. We will need follow-up blood work for this and if still off ,  will order an ultrasound of the liver to be sure no other concerns.   I see that you had a pelvic ultrasound in the meantime  Please order LFTs  in about month ( please look at the future orders that are now duplicates and cancel the old orders so they done get repeated unnecessarily)

## 2021-12-04 NOTE — Addendum Note (Signed)
Addended by: Encarnacion Slates on: 12/04/2021 11:43 AM   Modules accepted: Orders

## 2022-02-18 DIAGNOSIS — H5213 Myopia, bilateral: Secondary | ICD-10-CM | POA: Diagnosis not present

## 2022-03-04 ENCOUNTER — Telehealth: Payer: Self-pay

## 2022-03-04 NOTE — Telephone Encounter (Signed)
Received a message from [place] on team that pt is needing an immunization record for school.  Printed the record and placed in patient pick-up file cabinet.   Inform patient. Patient responded she will pick it up today.

## 2022-03-28 ENCOUNTER — Other Ambulatory Visit (HOSPITAL_COMMUNITY): Payer: Self-pay

## 2022-04-03 ENCOUNTER — Ambulatory Visit (INDEPENDENT_AMBULATORY_CARE_PROVIDER_SITE_OTHER): Payer: Commercial Managed Care - PPO | Admitting: *Deleted

## 2022-04-03 DIAGNOSIS — Z23 Encounter for immunization: Secondary | ICD-10-CM

## 2022-04-24 ENCOUNTER — Other Ambulatory Visit (HOSPITAL_COMMUNITY): Payer: Self-pay

## 2022-04-24 MED ORDER — DESOGESTREL-ETHINYL ESTRADIOL 0.15-30 MG-MCG PO TABS
1.0000 | ORAL_TABLET | Freq: Every day | ORAL | 3 refills | Status: DC
  Filled 2022-04-24: qty 84, 84d supply, fill #0
  Filled 2022-07-13: qty 84, 84d supply, fill #1
  Filled 2022-10-27: qty 84, 84d supply, fill #2
  Filled 2022-12-26 (×2): qty 84, 84d supply, fill #3

## 2022-05-02 ENCOUNTER — Ambulatory Visit (HOSPITAL_BASED_OUTPATIENT_CLINIC_OR_DEPARTMENT_OTHER): Payer: Commercial Managed Care - PPO | Admitting: Medical

## 2022-05-02 ENCOUNTER — Other Ambulatory Visit (HOSPITAL_COMMUNITY)
Admission: RE | Admit: 2022-05-02 | Discharge: 2022-05-02 | Disposition: A | Discharge status: Home / Self Care | Payer: Commercial Managed Care - PPO | Source: Ambulatory Visit | Attending: Medical | Admitting: Medical

## 2022-05-02 ENCOUNTER — Encounter (HOSPITAL_BASED_OUTPATIENT_CLINIC_OR_DEPARTMENT_OTHER): Payer: Commercial Managed Care - PPO | Admitting: Medical

## 2022-05-02 ENCOUNTER — Encounter (HOSPITAL_BASED_OUTPATIENT_CLINIC_OR_DEPARTMENT_OTHER): Payer: Self-pay | Admitting: Medical

## 2022-05-02 ENCOUNTER — Encounter (HOSPITAL_BASED_OUTPATIENT_CLINIC_OR_DEPARTMENT_OTHER): Payer: Self-pay

## 2022-05-02 VITALS — BP 120/85 | HR 84 | Ht 62.0 in | Wt 186.2 lb

## 2022-05-02 DIAGNOSIS — Z01419 Encounter for gynecological examination (general) (routine) without abnormal findings: Secondary | ICD-10-CM

## 2022-05-02 NOTE — Progress Notes (Addendum)
History:  Ms. Rachel Skinner is a 22 y.o. G0P0000 who presents to clinic today for annual exam with pap smear. Patient has never had a pap smear before. She denies bleeding, discharge, pain, UTI symptoms, GI symptoms or breast concerns today. She has a history of hypothyroidism and is followed by Endocrinology. She has a history of PCOS and was on OCPs which regulated her period. She stopped them last year and didn't have a period for 8 months. She has since been having periods every other month for the latter part of 2023. LMP first week of February. She is planning to start back on OCPs to regulate her periods. She is not currently sexually active and hasn't been for about a year.   The following portions of the patient's history were reviewed and updated as appropriate: allergies, current medications, family history, past medical history, social history, past surgical history and problem list.  Review of Systems:  Review of Systems  Constitutional:  Negative for fever and malaise/fatigue.  Gastrointestinal:  Negative for abdominal pain, constipation, diarrhea, nausea and vomiting.  Genitourinary:  Negative for dysuria, frequency and urgency.       Neg - vaginal bleeding, discharge, pelvic pain      Objective:  Physical Exam BP 120/85   Pulse 84   Ht '5\' 2"'$  (1.575 m)   Wt 186 lb 3.2 oz (84.5 kg)   BMI 34.06 kg/m  Physical Exam Vitals and nursing note reviewed. Exam conducted with a chaperone present.  Constitutional:      General: She is not in acute distress.    Appearance: Normal appearance. She is well-developed and normal weight.  HENT:     Head: Normocephalic and atraumatic.  Neck:     Thyroid: No thyromegaly.  Cardiovascular:     Rate and Rhythm: Normal rate and regular rhythm.     Heart sounds: No murmur heard. Pulmonary:     Effort: Pulmonary effort is normal. No respiratory distress.     Breath sounds: Normal breath sounds. No wheezing.  Chest:  Breasts:     Right: Inverted nipple present. No swelling, bleeding, mass, nipple discharge, skin change or tenderness.     Left: Inverted nipple present. No swelling, bleeding, mass, nipple discharge, skin change or tenderness.  Abdominal:     General: Abdomen is flat. Bowel sounds are normal. There is no distension.     Palpations: Abdomen is soft. There is no mass.     Tenderness: There is no abdominal tenderness. There is no guarding or rebound.  Genitourinary:    General: Normal vulva.     Vagina: Vaginal discharge (scant, white) present. No erythema, tenderness or bleeding.     Cervix: No cervical motion tenderness, discharge, friability, lesion, erythema or cervical bleeding.     Uterus: Not enlarged and not tender.      Adnexa:        Right: No mass or tenderness.         Left: No mass or tenderness.    Musculoskeletal:     Cervical back: Neck supple.  Skin:    General: Skin is warm and dry.     Findings: No erythema.  Neurological:     Mental Status: She is alert and oriented to person, place, and time.  Psychiatric:        Mood and Affect: Mood normal.   Health Maintenance Due  Topic Date Due   PAP-Cervical Cytology Screening  Never done   PAP SMEAR-Modifier  Never  done   COVID-19 Vaccine (4 - 2023-24 season) 11/01/2021    Labs, imaging and previous visits in Epic and Care Everywhere reviewed  Assessment & Plan:  1. Encounter for annual routine gynecological examination  2. Encounter for routine gynecological examination with Papanicolaou smear of cervix - Cytology - PAP( [facility])  Discussed how and when to restart OCPs Advised of possibility of breakthrough bleeding in first few months If pap smear is normal, follow-up 3 years  Annual exam without pap smear available yearly  Otherwise follow-up PRN for other GYN concerns   Danielle Rankin 05/02/2022 9:59 AM

## 2022-05-05 LAB — CYTOLOGY - PAP: Diagnosis: NEGATIVE

## 2022-05-27 ENCOUNTER — Other Ambulatory Visit (HOSPITAL_COMMUNITY): Payer: Self-pay

## 2022-05-27 DIAGNOSIS — E669 Obesity, unspecified: Secondary | ICD-10-CM | POA: Diagnosis not present

## 2022-05-27 DIAGNOSIS — R102 Pelvic and perineal pain: Secondary | ICD-10-CM | POA: Diagnosis not present

## 2022-05-27 DIAGNOSIS — L680 Hirsutism: Secondary | ICD-10-CM | POA: Diagnosis not present

## 2022-05-27 DIAGNOSIS — L659 Nonscarring hair loss, unspecified: Secondary | ICD-10-CM | POA: Diagnosis not present

## 2022-05-27 DIAGNOSIS — E282 Polycystic ovarian syndrome: Secondary | ICD-10-CM | POA: Diagnosis not present

## 2022-05-27 DIAGNOSIS — F411 Generalized anxiety disorder: Secondary | ICD-10-CM | POA: Diagnosis not present

## 2022-05-27 DIAGNOSIS — L709 Acne, unspecified: Secondary | ICD-10-CM | POA: Diagnosis not present

## 2022-05-27 DIAGNOSIS — E038 Other specified hypothyroidism: Secondary | ICD-10-CM | POA: Diagnosis not present

## 2022-05-27 DIAGNOSIS — E063 Autoimmune thyroiditis: Secondary | ICD-10-CM | POA: Diagnosis not present

## 2022-05-27 MED ORDER — METFORMIN HCL ER 500 MG PO TB24
500.0000 mg | ORAL_TABLET | Freq: Every day | ORAL | 4 refills | Status: DC
  Filled 2022-05-27: qty 90, 90d supply, fill #0
  Filled 2022-07-13 – 2022-10-27 (×2): qty 90, 90d supply, fill #1
  Filled 2023-04-12: qty 90, 90d supply, fill #2

## 2022-05-27 MED ORDER — SPIRONOLACTONE 25 MG PO TABS
25.0000 mg | ORAL_TABLET | Freq: Every day | ORAL | 4 refills | Status: DC
  Filled 2022-05-27: qty 90, 90d supply, fill #0

## 2022-06-03 DIAGNOSIS — E038 Other specified hypothyroidism: Secondary | ICD-10-CM | POA: Diagnosis not present

## 2022-06-03 DIAGNOSIS — E282 Polycystic ovarian syndrome: Secondary | ICD-10-CM | POA: Diagnosis not present

## 2022-06-03 LAB — TSH: TSH: 0.86 (ref 0.41–5.90)

## 2022-06-23 ENCOUNTER — Encounter (HOSPITAL_BASED_OUTPATIENT_CLINIC_OR_DEPARTMENT_OTHER): Payer: 59 | Admitting: Obstetrics & Gynecology

## 2022-06-24 ENCOUNTER — Other Ambulatory Visit (HOSPITAL_COMMUNITY): Payer: Self-pay

## 2022-06-24 DIAGNOSIS — E038 Other specified hypothyroidism: Secondary | ICD-10-CM | POA: Diagnosis not present

## 2022-06-24 DIAGNOSIS — E282 Polycystic ovarian syndrome: Secondary | ICD-10-CM | POA: Diagnosis not present

## 2022-06-24 DIAGNOSIS — L680 Hirsutism: Secondary | ICD-10-CM | POA: Diagnosis not present

## 2022-06-24 DIAGNOSIS — E063 Autoimmune thyroiditis: Secondary | ICD-10-CM | POA: Diagnosis not present

## 2022-06-24 DIAGNOSIS — E669 Obesity, unspecified: Secondary | ICD-10-CM | POA: Diagnosis not present

## 2022-06-24 MED ORDER — SPIRONOLACTONE 50 MG PO TABS
50.0000 mg | ORAL_TABLET | Freq: Every day | ORAL | 4 refills | Status: DC
  Filled 2022-06-24 – 2022-07-13 (×2): qty 90, 90d supply, fill #0
  Filled 2022-10-27: qty 90, 90d supply, fill #1

## 2022-07-07 DIAGNOSIS — E038 Other specified hypothyroidism: Secondary | ICD-10-CM | POA: Diagnosis not present

## 2022-07-07 LAB — COMPREHENSIVE METABOLIC PANEL
Albumin: 4.5 (ref 3.5–5.0)
Calcium: 10 (ref 8.7–10.7)
eGFR: 111

## 2022-07-07 LAB — BASIC METABOLIC PANEL
BUN: 14 (ref 4–21)
Creatinine: 0.8 (ref 0.5–1.1)
Glucose: 86
Potassium: 4.6 mEq/L (ref 3.5–5.1)
Sodium: 137 (ref 137–147)

## 2022-07-10 ENCOUNTER — Other Ambulatory Visit (HOSPITAL_COMMUNITY): Payer: Self-pay

## 2022-07-11 ENCOUNTER — Other Ambulatory Visit: Payer: Self-pay

## 2022-07-14 ENCOUNTER — Other Ambulatory Visit: Payer: Self-pay

## 2022-07-16 DIAGNOSIS — H5213 Myopia, bilateral: Secondary | ICD-10-CM | POA: Diagnosis not present

## 2022-07-16 DIAGNOSIS — H538 Other visual disturbances: Secondary | ICD-10-CM | POA: Diagnosis not present

## 2022-08-25 DIAGNOSIS — H538 Other visual disturbances: Secondary | ICD-10-CM | POA: Diagnosis not present

## 2022-08-25 DIAGNOSIS — E038 Other specified hypothyroidism: Secondary | ICD-10-CM | POA: Diagnosis not present

## 2022-08-25 DIAGNOSIS — E282 Polycystic ovarian syndrome: Secondary | ICD-10-CM | POA: Diagnosis not present

## 2022-08-25 DIAGNOSIS — R519 Headache, unspecified: Secondary | ICD-10-CM | POA: Diagnosis not present

## 2022-08-25 DIAGNOSIS — E663 Overweight: Secondary | ICD-10-CM | POA: Diagnosis not present

## 2022-08-25 DIAGNOSIS — E063 Autoimmune thyroiditis: Secondary | ICD-10-CM | POA: Diagnosis not present

## 2022-08-25 DIAGNOSIS — L680 Hirsutism: Secondary | ICD-10-CM | POA: Diagnosis not present

## 2022-08-25 DIAGNOSIS — L659 Nonscarring hair loss, unspecified: Secondary | ICD-10-CM | POA: Diagnosis not present

## 2022-08-26 ENCOUNTER — Encounter: Payer: Self-pay | Admitting: Internal Medicine

## 2022-08-28 ENCOUNTER — Ambulatory Visit (HOSPITAL_BASED_OUTPATIENT_CLINIC_OR_DEPARTMENT_OTHER): Payer: Self-pay | Admitting: Obstetrics & Gynecology

## 2022-08-28 ENCOUNTER — Other Ambulatory Visit (HOSPITAL_COMMUNITY): Payer: Self-pay

## 2022-08-28 ENCOUNTER — Encounter (HOSPITAL_BASED_OUTPATIENT_CLINIC_OR_DEPARTMENT_OTHER): Payer: Self-pay

## 2022-08-28 ENCOUNTER — Encounter (HOSPITAL_BASED_OUTPATIENT_CLINIC_OR_DEPARTMENT_OTHER): Payer: Self-pay | Admitting: Obstetrics & Gynecology

## 2022-08-28 VITALS — BP 124/76 | HR 97 | Ht 62.0 in | Wt 164.0 lb

## 2022-08-28 DIAGNOSIS — R1032 Left lower quadrant pain: Secondary | ICD-10-CM

## 2022-08-28 MED ORDER — SULFAMETHOXAZOLE-TRIMETHOPRIM 800-160 MG PO TABS
1.0000 | ORAL_TABLET | Freq: Two times a day (BID) | ORAL | 0 refills | Status: DC
  Filled 2022-08-28: qty 20, 10d supply, fill #0

## 2022-09-01 NOTE — Progress Notes (Signed)
Pt cancelled and rescheduled appt.  Chart was already opened.

## 2022-09-09 DIAGNOSIS — L0592 Pilonidal sinus without abscess: Secondary | ICD-10-CM | POA: Diagnosis not present

## 2022-09-16 ENCOUNTER — Encounter (HOSPITAL_BASED_OUTPATIENT_CLINIC_OR_DEPARTMENT_OTHER): Payer: Self-pay | Admitting: Obstetrics & Gynecology

## 2022-09-16 ENCOUNTER — Ambulatory Visit (HOSPITAL_BASED_OUTPATIENT_CLINIC_OR_DEPARTMENT_OTHER): Payer: Commercial Managed Care - PPO | Admitting: Obstetrics & Gynecology

## 2022-09-16 VITALS — BP 120/80 | HR 95 | Ht 62.0 in | Wt 160.8 lb

## 2022-09-16 DIAGNOSIS — R7989 Other specified abnormal findings of blood chemistry: Secondary | ICD-10-CM

## 2022-09-16 DIAGNOSIS — R1032 Left lower quadrant pain: Secondary | ICD-10-CM | POA: Diagnosis not present

## 2022-09-16 DIAGNOSIS — E282 Polycystic ovarian syndrome: Secondary | ICD-10-CM | POA: Diagnosis not present

## 2022-09-16 NOTE — Progress Notes (Unsigned)
GYNECOLOGY  VISIT  CC:   questions about PCOS and some LLQ pain  HPI: 22 y.o. G0P0000 Single White or Caucasian female here for questions about PCOS.  She is followed by Dr. Sharl Ma, endocrinology.  She is on metformin XR 500mg  daily.  She is also on spironolactone 50mg  daily.  She is on Apri and has been on this possibly a year.  She is unsure of exactly when she started it.  She did have a period of time last year when she skipped cycles for 6-8 months.  She had lab work done with Dr Sharl Ma and can show me these today.  Testosterone elevated around 100.  17-OH P, DEA-S, FSH, LS and prolactin all normal.  Ultrasound showed polycystic ovaries and this was done 11/15/2021.  Reviewed images personally.  Pt had normal pap smear 05/2022.     Past Medical History:  Diagnosis Date   Hashimoto's disease    Hypothyroid    Infected pilonidal cyst 03/04/2013   PCOS (polycystic ovarian syndrome)    Pilonidal cyst 01/2013   is open and draining   Pilonidal sinus with abscess 01/31/2013   draining   no mass effect  empriric antibiotic cause of sx persistent and tender  fu in 2-3 weeks       MEDS:   Current Outpatient Medications on File Prior to Visit  Medication Sig Dispense Refill   desogestrel-ethinyl estradiol (APRI) 0.15-30 MG-MCG tablet Take 1 tablet by mouth daily. 84 tablet 3   metFORMIN (GLUCOPHAGE-XR) 500 MG 24 hr tablet Take 1 tablet (500 mg total) by mouth once daily with a meal 90 tablet 4   spironolactone (ALDACTONE) 50 MG tablet Take 1 tablet (50 mg total) by mouth daily. 90 tablet 4   sulfamethoxazole-trimethoprim (BACTRIM DS) 800-160 MG tablet Take 1 tablet by mouth twice daily x 10 days 20 tablet 0   SYNTHROID 88 MCG tablet Take 1 tablet (88 mcg total) by mouth in the morning on an empty stomach. 90 tablet 4   No current facility-administered medications on file prior to visit.    ALLERGIES: Morphine and codeine and Clindamycin/lincomycin  SH:  single, non smoker  Review of  Systems  Constitutional: Negative.   Genitourinary:        LLQ pain    PHYSICAL EXAMINATION:    BP (!) 123/90 (BP Location: Right Arm, Patient Position: Sitting, Cuff Size: Normal)   Pulse (!) 105   Ht 5\' 2"  (1.575 m) Comment: Reported  Wt 160 lb 12.8 oz (72.9 kg)   LMP 08/28/2022   BMI 29.41 kg/m     General appearance: alert, cooperative and appears stated age  Abdomen: soft, non-tender; bowel sounds normal; no masses,  no organomegaly, no tenderness on exam today Lymph:  no inguinal LAD noted  Pelvic: External genitalia:  no lesions              Urethra:  normal appearing urethra with no masses, tenderness or lesions              Bartholins and Skenes: normal                 Vagina: normal appearing vagina with normal color and discharge, no lesions              Cervix: no lesions              Bimanual Exam:  Uterus:  normal size, contour, position, consistency, mobility, non-tender  Adnexa: no mass, fullness, tenderness              Chaperone, Raechel Ache, RN, was present for exam.  Assessment/Plan: 1. PCOS (polycystic ovarian syndrome) - is on metformin, OCPs and spironlactone.  Reviewed with pt this is typically what I use as well for treatment and would not make any recommendations for changes - discussed possible need for ovulation induction agents/REI when ready to try for pregnancy.  Currently, not planning pregnancy in near future  2. LLQ pain - normal exam today.  Recommended she return when she is feeling this more intensely for repeat exam  3. Elevated testosterone level - recommended repeating and pt does have follow up with Dr. Sharl Ma so will plan to have done at this visit

## 2022-09-17 DIAGNOSIS — L700 Acne vulgaris: Secondary | ICD-10-CM | POA: Insufficient documentation

## 2022-09-17 DIAGNOSIS — E282 Polycystic ovarian syndrome: Secondary | ICD-10-CM | POA: Insufficient documentation

## 2022-10-27 ENCOUNTER — Other Ambulatory Visit (HOSPITAL_COMMUNITY): Payer: Self-pay

## 2022-10-27 DIAGNOSIS — E063 Autoimmune thyroiditis: Secondary | ICD-10-CM | POA: Diagnosis not present

## 2022-10-27 DIAGNOSIS — E282 Polycystic ovarian syndrome: Secondary | ICD-10-CM | POA: Diagnosis not present

## 2022-10-27 DIAGNOSIS — L680 Hirsutism: Secondary | ICD-10-CM | POA: Diagnosis not present

## 2022-10-27 DIAGNOSIS — E038 Other specified hypothyroidism: Secondary | ICD-10-CM | POA: Diagnosis not present

## 2022-10-27 DIAGNOSIS — L659 Nonscarring hair loss, unspecified: Secondary | ICD-10-CM | POA: Diagnosis not present

## 2022-10-27 DIAGNOSIS — L709 Acne, unspecified: Secondary | ICD-10-CM | POA: Diagnosis not present

## 2022-10-27 MED ORDER — SPIRONOLACTONE 100 MG PO TABS
100.0000 mg | ORAL_TABLET | Freq: Every day | ORAL | 4 refills | Status: DC
  Filled 2022-10-27: qty 90, 90d supply, fill #0
  Filled 2023-04-12: qty 90, 90d supply, fill #1

## 2022-11-04 DIAGNOSIS — E282 Polycystic ovarian syndrome: Secondary | ICD-10-CM | POA: Diagnosis not present

## 2022-11-27 DIAGNOSIS — E282 Polycystic ovarian syndrome: Secondary | ICD-10-CM | POA: Diagnosis not present

## 2022-12-26 ENCOUNTER — Other Ambulatory Visit: Payer: Self-pay | Admitting: Internal Medicine

## 2022-12-26 ENCOUNTER — Other Ambulatory Visit (HOSPITAL_COMMUNITY): Payer: Self-pay

## 2022-12-26 MED ORDER — CYRED EQ 0.15-30 MG-MCG PO TABS
1.0000 | ORAL_TABLET | Freq: Every day | ORAL | 3 refills | Status: DC
  Filled 2022-12-26 – 2023-01-23 (×2): qty 84, 84d supply, fill #0
  Filled 2023-04-12: qty 84, 84d supply, fill #1
  Filled 2023-07-05: qty 84, 84d supply, fill #2
  Filled 2023-09-18: qty 84, 84d supply, fill #3

## 2023-01-23 ENCOUNTER — Other Ambulatory Visit (HOSPITAL_COMMUNITY): Payer: Self-pay

## 2023-01-27 DIAGNOSIS — L680 Hirsutism: Secondary | ICD-10-CM | POA: Diagnosis not present

## 2023-01-27 DIAGNOSIS — L659 Nonscarring hair loss, unspecified: Secondary | ICD-10-CM | POA: Diagnosis not present

## 2023-01-27 DIAGNOSIS — E282 Polycystic ovarian syndrome: Secondary | ICD-10-CM | POA: Diagnosis not present

## 2023-01-27 DIAGNOSIS — E038 Other specified hypothyroidism: Secondary | ICD-10-CM | POA: Diagnosis not present

## 2023-01-27 DIAGNOSIS — E063 Autoimmune thyroiditis: Secondary | ICD-10-CM | POA: Diagnosis not present

## 2023-01-27 DIAGNOSIS — L709 Acne, unspecified: Secondary | ICD-10-CM | POA: Diagnosis not present

## 2023-04-16 ENCOUNTER — Other Ambulatory Visit (HOSPITAL_COMMUNITY): Payer: Self-pay

## 2023-04-17 ENCOUNTER — Other Ambulatory Visit (HOSPITAL_COMMUNITY): Payer: Self-pay

## 2023-07-11 ENCOUNTER — Other Ambulatory Visit (HOSPITAL_COMMUNITY): Payer: Self-pay

## 2023-09-19 ENCOUNTER — Other Ambulatory Visit (HOSPITAL_COMMUNITY): Payer: Self-pay

## 2023-11-19 ENCOUNTER — Other Ambulatory Visit: Payer: Self-pay

## 2023-11-19 ENCOUNTER — Other Ambulatory Visit (HOSPITAL_COMMUNITY): Payer: Self-pay

## 2023-11-19 ENCOUNTER — Encounter (HOSPITAL_COMMUNITY): Payer: Self-pay

## 2023-11-19 DIAGNOSIS — E063 Autoimmune thyroiditis: Secondary | ICD-10-CM | POA: Diagnosis not present

## 2023-11-19 DIAGNOSIS — L680 Hirsutism: Secondary | ICD-10-CM | POA: Diagnosis not present

## 2023-11-19 DIAGNOSIS — L659 Nonscarring hair loss, unspecified: Secondary | ICD-10-CM | POA: Diagnosis not present

## 2023-11-19 DIAGNOSIS — E282 Polycystic ovarian syndrome: Secondary | ICD-10-CM | POA: Diagnosis not present

## 2023-11-19 DIAGNOSIS — L709 Acne, unspecified: Secondary | ICD-10-CM | POA: Diagnosis not present

## 2023-11-19 DIAGNOSIS — E038 Other specified hypothyroidism: Secondary | ICD-10-CM | POA: Diagnosis not present

## 2023-11-19 DIAGNOSIS — E669 Obesity, unspecified: Secondary | ICD-10-CM | POA: Diagnosis not present

## 2023-11-19 MED ORDER — SPIRONOLACTONE 100 MG PO TABS
100.0000 mg | ORAL_TABLET | Freq: Every day | ORAL | 4 refills | Status: DC
  Filled 2023-11-19 – 2023-12-16 (×3): qty 90, 90d supply, fill #0

## 2023-11-19 MED ORDER — ZEPBOUND 2.5 MG/0.5ML ~~LOC~~ SOAJ
2.5000 mg | SUBCUTANEOUS | 0 refills | Status: DC
  Filled 2023-11-19: qty 2, 28d supply, fill #0

## 2023-11-19 MED ORDER — APRI 0.15-30 MG-MCG PO TABS
1.0000 | ORAL_TABLET | Freq: Every day | ORAL | 4 refills | Status: AC
  Filled 2023-11-19 – 2023-12-16 (×4): qty 84, 84d supply, fill #0
  Filled 2024-03-06: qty 84, 84d supply, fill #1

## 2023-11-19 MED ORDER — SYNTHROID 88 MCG PO TABS
88.0000 ug | ORAL_TABLET | Freq: Every morning | ORAL | 4 refills | Status: DC
  Filled 2023-11-19: qty 90, 90d supply, fill #0

## 2023-11-30 ENCOUNTER — Other Ambulatory Visit (HOSPITAL_COMMUNITY): Payer: Self-pay

## 2023-12-04 ENCOUNTER — Other Ambulatory Visit (HOSPITAL_COMMUNITY): Payer: Self-pay

## 2023-12-04 ENCOUNTER — Other Ambulatory Visit: Payer: Self-pay

## 2023-12-04 MED ORDER — METFORMIN HCL ER 500 MG PO TB24
500.0000 mg | ORAL_TABLET | Freq: Every day | ORAL | 3 refills | Status: DC
  Filled 2023-12-04 – 2023-12-16 (×2): qty 90, 90d supply, fill #0

## 2023-12-10 ENCOUNTER — Encounter (HOSPITAL_COMMUNITY): Payer: Self-pay | Admitting: Pharmacist

## 2023-12-10 ENCOUNTER — Other Ambulatory Visit (HOSPITAL_COMMUNITY): Payer: Self-pay

## 2023-12-10 MED ORDER — TIRZEPATIDE-WEIGHT MANAGEMENT 5 MG/0.5ML ~~LOC~~ SOAJ
5.0000 mg | SUBCUTANEOUS | 0 refills | Status: DC
  Filled 2023-12-10 – 2023-12-16 (×2): qty 2, 28d supply, fill #0

## 2023-12-16 ENCOUNTER — Other Ambulatory Visit (HOSPITAL_COMMUNITY): Payer: Self-pay

## 2023-12-24 ENCOUNTER — Other Ambulatory Visit (HOSPITAL_COMMUNITY): Payer: Self-pay

## 2024-01-11 ENCOUNTER — Other Ambulatory Visit (HOSPITAL_COMMUNITY): Payer: Self-pay

## 2024-01-11 MED ORDER — ZEPBOUND 7.5 MG/0.5ML ~~LOC~~ SOAJ
7.5000 mg | SUBCUTANEOUS | 0 refills | Status: DC
  Filled 2024-01-11: qty 2, 28d supply, fill #0

## 2024-01-14 ENCOUNTER — Other Ambulatory Visit (HOSPITAL_COMMUNITY): Payer: Self-pay

## 2024-02-04 ENCOUNTER — Other Ambulatory Visit (HOSPITAL_COMMUNITY): Payer: Self-pay

## 2024-02-05 ENCOUNTER — Other Ambulatory Visit (HOSPITAL_COMMUNITY): Payer: Self-pay

## 2024-02-05 MED ORDER — ZEPBOUND 7.5 MG/0.5ML ~~LOC~~ SOAJ
7.5000 mg | SUBCUTANEOUS | 0 refills | Status: DC
  Filled 2024-02-05 – 2024-03-11 (×2): qty 2, 28d supply, fill #0

## 2024-02-09 ENCOUNTER — Encounter (HOSPITAL_BASED_OUTPATIENT_CLINIC_OR_DEPARTMENT_OTHER): Payer: Self-pay | Admitting: Obstetrics & Gynecology

## 2024-02-17 ENCOUNTER — Other Ambulatory Visit (HOSPITAL_COMMUNITY): Payer: Self-pay

## 2024-03-11 ENCOUNTER — Other Ambulatory Visit (HOSPITAL_BASED_OUTPATIENT_CLINIC_OR_DEPARTMENT_OTHER): Payer: Self-pay

## 2024-03-15 NOTE — Progress Notes (Unsigned)
 "  ANNUAL EXAM Patient name: Rachel Skinner MRN [id number]  Date of birth: 2000/09/23 Chief Complaint:   No chief complaint on file.  History of Present Illness:   Rachel Skinner is a 24 y.o. G0P0000 {race:25618} female being seen today for a routine annual exam.  Current complaints: ***  No LMP recorded. (Menstrual status: Other).   The pregnancy intention screening data noted above was reviewed. Potential methods of contraception were discussed. The patient elected to proceed with No data recorded.   Last pap 05/02/2022. Results were: NILM w/ HRHPV not done. H/O abnormal pap: {yes/yes***/no:23866} Last mammogram: Family h/o breast cancer: {yes***/no:23838} Last colonoscopy: Family h/o colorectal cancer: {yes***/no:23838}     09/16/2022    1:46 PM 08/28/2022    2:32 PM 10/15/2021    1:41 PM 07/19/2021    8:14 AM 08/13/2020   10:21 AM  Depression screen PHQ 2/9  Decreased Interest 0 0 1 0 0  Down, Depressed, Hopeless 0 0 1 0 0  PHQ - 2 Score 0 0 2 0 0  Altered sleeping   2  0  Tired, decreased energy   1  0  Change in appetite   1  0  Feeling bad or failure about yourself    1  0  Trouble concentrating   2  0  Moving slowly or fidgety/restless   1  0  Suicidal thoughts   0  0  PHQ-9 Score   10   0   Difficult doing work/chores   Very difficult       Data saved with a previous flowsheet row definition         No data to display           Review of Systems:   Pertinent items are noted in HPI Denies any headaches, blurred vision, fatigue, shortness of breath, chest pain, abdominal pain, abnormal vaginal discharge/itching/odor/irritation, problems with periods, bowel movements, urination, or intercourse unless otherwise stated above. Pertinent History Reviewed:  Reviewed past medical,surgical, social and family history.  Reviewed problem list, medications and allergies. Physical Assessment:  There were no vitals filed for this visit.There is no height or weight on  file to calculate BMI.        Physical Examination:   General appearance - well appearing, and in no distress  Mental status - alert, oriented to person, place, and time  Psych:  She has a normal mood and affect  Skin - warm and dry, normal color, no suspicious lesions noted  Chest - effort normal, all lung fields clear to auscultation bilaterally  Heart - normal rate and regular rhythm  Neck:  midline trachea, no thyromegaly or nodules  Breasts - breasts appear normal, no suspicious masses, no skin or nipple changes or  axillary nodes  Abdomen - soft, nontender, nondistended, no masses or organomegaly  Pelvic - VULVA: normal appearing vulva with no masses, tenderness or lesions  VAGINA: normal appearing vagina with normal color and discharge, no lesions  CERVIX: normal appearing cervix without discharge or lesions, no CMT  Thin prep pap is {Desc; done/not:10129} *** HR HPV cotesting  UTERUS: uterus is felt to be normal size, shape, consistency and nontender   ADNEXA: No adnexal masses or tenderness noted.  Rectal - normal rectal, good sphincter tone, no masses felt. Hemoccult: ***  Extremities:  No swelling or varicosities noted  Chaperone present for exam  No results found for this or any previous visit (from the past 24 hours).  Assessment &  Plan:  1) Well-Woman Exam  2) ***  Labs/procedures today: ***  Mammogram: {Mammo f/u:25212::@ 25yo}, or sooner if problems Colonoscopy: {TCS f/u:25213::@ 25yo}, or sooner if problems  No orders of the defined types were placed in this encounter.   Meds: No orders of the defined types were placed in this encounter.   Follow-up: No follow-ups on file.  Morna LOISE Quale, RN 03/15/2024 8:37 AM "

## 2024-03-16 ENCOUNTER — Ambulatory Visit (HOSPITAL_BASED_OUTPATIENT_CLINIC_OR_DEPARTMENT_OTHER): Admitting: Obstetrics & Gynecology

## 2024-03-16 ENCOUNTER — Encounter: Admitting: Internal Medicine

## 2024-03-16 ENCOUNTER — Encounter (HOSPITAL_BASED_OUTPATIENT_CLINIC_OR_DEPARTMENT_OTHER): Payer: Self-pay | Admitting: Obstetrics & Gynecology

## 2024-03-16 ENCOUNTER — Other Ambulatory Visit (HOSPITAL_COMMUNITY)
Admission: RE | Admit: 2024-03-16 | Discharge: 2024-03-16 | Disposition: A | Source: Ambulatory Visit | Attending: Obstetrics & Gynecology | Admitting: Obstetrics & Gynecology

## 2024-03-16 VITALS — BP 109/71 | HR 93 | Ht 62.0 in | Wt 171.0 lb

## 2024-03-16 DIAGNOSIS — Z113 Encounter for screening for infections with a predominantly sexual mode of transmission: Secondary | ICD-10-CM

## 2024-03-16 DIAGNOSIS — Z01419 Encounter for gynecological examination (general) (routine) without abnormal findings: Secondary | ICD-10-CM | POA: Diagnosis not present

## 2024-03-16 DIAGNOSIS — E282 Polycystic ovarian syndrome: Secondary | ICD-10-CM

## 2024-03-16 DIAGNOSIS — E063 Autoimmune thyroiditis: Secondary | ICD-10-CM | POA: Diagnosis not present

## 2024-03-16 DIAGNOSIS — Z124 Encounter for screening for malignant neoplasm of cervix: Secondary | ICD-10-CM | POA: Insufficient documentation

## 2024-03-16 DIAGNOSIS — Z1151 Encounter for screening for human papillomavirus (HPV): Secondary | ICD-10-CM

## 2024-03-16 DIAGNOSIS — R1032 Left lower quadrant pain: Secondary | ICD-10-CM

## 2024-03-23 LAB — CYTOLOGY - PAP
Chlamydia: NEGATIVE
Comment: NEGATIVE
Comment: NORMAL
Diagnosis: NEGATIVE
Diagnosis: REACTIVE
Neisseria Gonorrhea: NEGATIVE

## 2024-03-25 ENCOUNTER — Ambulatory Visit (HOSPITAL_BASED_OUTPATIENT_CLINIC_OR_DEPARTMENT_OTHER): Payer: Self-pay | Admitting: Obstetrics & Gynecology

## 2024-04-06 ENCOUNTER — Other Ambulatory Visit (HOSPITAL_BASED_OUTPATIENT_CLINIC_OR_DEPARTMENT_OTHER): Payer: Self-pay

## 2024-04-06 MED ORDER — ZEPBOUND 7.5 MG/0.5ML ~~LOC~~ SOAJ
7.5000 mg | SUBCUTANEOUS | 0 refills | Status: AC
  Filled 2024-04-06: qty 2, 28d supply, fill #0

## 2024-04-13 ENCOUNTER — Encounter: Admitting: Internal Medicine

## 2024-04-13 ENCOUNTER — Other Ambulatory Visit (HOSPITAL_BASED_OUTPATIENT_CLINIC_OR_DEPARTMENT_OTHER)

## 2024-04-13 ENCOUNTER — Other Ambulatory Visit (HOSPITAL_BASED_OUTPATIENT_CLINIC_OR_DEPARTMENT_OTHER): Admitting: Obstetrics & Gynecology

## 2024-04-13 ENCOUNTER — Other Ambulatory Visit (HOSPITAL_BASED_OUTPATIENT_CLINIC_OR_DEPARTMENT_OTHER): Payer: Self-pay | Admitting: Obstetrics & Gynecology

## 2024-05-11 ENCOUNTER — Ambulatory Visit: Admitting: Physical Therapy
